# Patient Record
Sex: Female | Born: 1980 | Race: White | Hispanic: No | Smoking: Current every day smoker
Health system: Southern US, Community
[De-identification: ages and names within clinical notes are randomized; demographics above are authoritative.]

## PROBLEM LIST (undated history)

## (undated) DIAGNOSIS — E282 Polycystic ovarian syndrome: Secondary | ICD-10-CM

## (undated) DIAGNOSIS — K279 Peptic ulcer, site unspecified, unspecified as acute or chronic, without hemorrhage or perforation: Secondary | ICD-10-CM

## (undated) DIAGNOSIS — M79669 Pain in unspecified lower leg: Secondary | ICD-10-CM

## (undated) DIAGNOSIS — F410 Panic disorder [episodic paroxysmal anxiety] without agoraphobia: Secondary | ICD-10-CM

## (undated) HISTORY — DX: Pain in unspecified lower leg: M79.669

## (undated) HISTORY — DX: Panic disorder (episodic paroxysmal anxiety): F41.0

## (undated) HISTORY — DX: Polycystic ovarian syndrome: E28.2

## (undated) HISTORY — DX: Peptic ulcer, site unspecified, unspecified as acute or chronic, without hemorrhage or perforation: K27.9

## (undated) HISTORY — PX: MOUTH SURGERY: SHX715

---

## 2003-05-06 ENCOUNTER — Encounter: Payer: Self-pay | Admitting: Otolaryngology

## 2003-05-06 ENCOUNTER — Encounter: Admission: RE | Admit: 2003-05-06 | Discharge: 2003-05-06 | Payer: Self-pay | Admitting: Otolaryngology

## 2003-07-06 ENCOUNTER — Other Ambulatory Visit: Admission: RE | Admit: 2003-07-06 | Discharge: 2003-07-06 | Payer: Self-pay | Admitting: Family Medicine

## 2003-07-06 ENCOUNTER — Other Ambulatory Visit: Admission: RE | Admit: 2003-07-06 | Discharge: 2003-07-06 | Payer: Self-pay | Admitting: Obstetrics & Gynecology

## 2003-12-01 ENCOUNTER — Ambulatory Visit (HOSPITAL_COMMUNITY): Admission: RE | Admit: 2003-12-01 | Discharge: 2003-12-01 | Payer: Self-pay | Admitting: Obstetrics & Gynecology

## 2003-12-13 ENCOUNTER — Inpatient Hospital Stay (HOSPITAL_COMMUNITY): Admission: AD | Admit: 2003-12-13 | Discharge: 2003-12-13 | Payer: Self-pay | Admitting: Obstetrics & Gynecology

## 2004-02-13 ENCOUNTER — Inpatient Hospital Stay (HOSPITAL_COMMUNITY): Admission: AD | Admit: 2004-02-13 | Discharge: 2004-02-15 | Payer: Self-pay | Admitting: Obstetrics & Gynecology

## 2005-06-07 ENCOUNTER — Emergency Department (HOSPITAL_COMMUNITY): Admission: EM | Admit: 2005-06-07 | Discharge: 2005-06-07 | Payer: Self-pay | Admitting: Family Medicine

## 2005-08-14 ENCOUNTER — Other Ambulatory Visit: Admission: RE | Admit: 2005-08-14 | Discharge: 2005-08-14 | Payer: Self-pay | Admitting: Obstetrics and Gynecology

## 2005-08-15 ENCOUNTER — Ambulatory Visit (HOSPITAL_COMMUNITY): Admission: RE | Admit: 2005-08-15 | Discharge: 2005-08-15 | Payer: Self-pay | Admitting: Obstetrics and Gynecology

## 2005-08-15 ENCOUNTER — Encounter (INDEPENDENT_AMBULATORY_CARE_PROVIDER_SITE_OTHER): Payer: Self-pay | Admitting: Specialist

## 2006-06-28 ENCOUNTER — Ambulatory Visit (HOSPITAL_COMMUNITY): Admission: RE | Admit: 2006-06-28 | Discharge: 2006-06-28 | Payer: Self-pay | Admitting: Obstetrics and Gynecology

## 2007-02-09 ENCOUNTER — Emergency Department (HOSPITAL_COMMUNITY): Admission: EM | Admit: 2007-02-09 | Discharge: 2007-02-09 | Payer: Self-pay | Admitting: Emergency Medicine

## 2007-09-24 ENCOUNTER — Encounter: Admission: RE | Admit: 2007-09-24 | Discharge: 2007-09-24 | Payer: Self-pay | Admitting: Certified Nurse Midwife

## 2008-12-17 ENCOUNTER — Emergency Department (HOSPITAL_COMMUNITY): Admission: EM | Admit: 2008-12-17 | Discharge: 2008-12-17 | Payer: Self-pay | Admitting: Family Medicine

## 2010-04-09 ENCOUNTER — Emergency Department (HOSPITAL_COMMUNITY): Admission: EM | Admit: 2010-04-09 | Discharge: 2010-04-09 | Payer: Self-pay | Admitting: Family Medicine

## 2010-10-03 ENCOUNTER — Inpatient Hospital Stay (HOSPITAL_COMMUNITY)
Admission: AD | Admit: 2010-10-03 | Discharge: 2010-10-05 | Payer: Self-pay | Source: Home / Self Care | Attending: Obstetrics and Gynecology | Admitting: Obstetrics and Gynecology

## 2010-10-04 ENCOUNTER — Encounter (INDEPENDENT_AMBULATORY_CARE_PROVIDER_SITE_OTHER): Payer: Self-pay | Admitting: Obstetrics and Gynecology

## 2010-10-05 LAB — CBC
HCT: 34.6 % — ABNORMAL LOW (ref 36.0–46.0)
Hemoglobin: 12.1 g/dL (ref 12.0–15.0)
MCH: 30.9 pg (ref 26.0–34.0)
MCHC: 35 g/dL (ref 30.0–36.0)
MCV: 88.5 fL (ref 78.0–100.0)
Platelets: 222 10*3/uL (ref 150–400)
RBC: 3.91 MIL/uL (ref 3.87–5.11)
RDW: 13.7 % (ref 11.5–15.5)
WBC: 25.1 10*3/uL — ABNORMAL HIGH (ref 4.0–10.5)

## 2010-10-05 LAB — DIFFERENTIAL
Basophils Absolute: 0 10*3/uL (ref 0.0–0.1)
Basophils Relative: 0 % (ref 0–1)
Eosinophils Absolute: 0.1 10*3/uL (ref 0.0–0.7)
Eosinophils Relative: 0 % (ref 0–5)
Lymphocytes Relative: 10 % — ABNORMAL LOW (ref 12–46)
Lymphs Abs: 2.6 10*3/uL (ref 0.7–4.0)
Monocytes Absolute: 1.9 10*3/uL — ABNORMAL HIGH (ref 0.1–1.0)
Monocytes Relative: 8 % (ref 3–12)
Neutro Abs: 20.5 10*3/uL — ABNORMAL HIGH (ref 1.7–7.7)
Neutrophils Relative %: 82 % — ABNORMAL HIGH (ref 43–77)

## 2010-10-05 LAB — URINALYSIS, MICROSCOPIC ONLY
Bilirubin Urine: NEGATIVE
Ketones, ur: 15 mg/dL — AB
Nitrite: NEGATIVE
Protein, ur: 30 mg/dL — AB
Specific Gravity, Urine: <1.005 — ABNORMAL LOW (ref 1.005–1.030)
Urine Glucose, Fasting: NEGATIVE mg/dL
Urobilinogen, UA: 0.2 mg/dL (ref 0.0–1.0)
pH: 6.5 (ref 5.0–8.0)

## 2010-10-05 LAB — ABO/RH: ABO/RH(D): A NEG

## 2010-10-05 LAB — RPR: RPR Ser Ql: NONREACTIVE

## 2010-10-10 LAB — RH IMMUNE GLOB WKUP(>/=20WKS)(NOT WOMEN'S HOSP)
Antibody Screen: POSITIVE
DAT, IgG: NEGATIVE
Fetal Screen: NEGATIVE
Unit division: 0

## 2010-10-10 LAB — CBC
HCT: 34.8 % — ABNORMAL LOW (ref 36.0–46.0)
Hemoglobin: 11.6 g/dL — ABNORMAL LOW (ref 12.0–15.0)
MCH: 30.1 pg (ref 26.0–34.0)
MCHC: 33.3 g/dL (ref 30.0–36.0)
MCV: 90.2 fL (ref 78.0–100.0)
Platelets: 198 10*3/uL (ref 150–400)
RBC: 3.86 MIL/uL — ABNORMAL LOW (ref 3.87–5.11)
RDW: 13.6 % (ref 11.5–15.5)
WBC: 15.7 10*3/uL — ABNORMAL HIGH (ref 4.0–10.5)

## 2010-10-10 LAB — COMPREHENSIVE METABOLIC PANEL
ALT: 13 U/L (ref 0–35)
AST: 27 U/L (ref 0–37)
Albumin: 2.7 g/dL — ABNORMAL LOW (ref 3.5–5.2)
Alkaline Phosphatase: 87 U/L (ref 39–117)
BUN: 5 mg/dL — ABNORMAL LOW (ref 6–23)
CO2: 25 mEq/L (ref 19–32)
Calcium: 9 mg/dL (ref 8.4–10.5)
Chloride: 104 mEq/L (ref 96–112)
Creatinine, Ser: 0.39 mg/dL — ABNORMAL LOW (ref 0.4–1.2)
GFR calc Af Amer: >60 mL/min (ref >60)
GFR calc non Af Amer: >60 mL/min (ref >60)
Glucose, Bld: 81 mg/dL (ref 70–99)
Potassium: 3.8 mEq/L (ref 3.5–5.1)
Sodium: 136 mEq/L (ref 135–145)
Total Bilirubin: 0.4 mg/dL (ref 0.3–1.2)
Total Protein: 5.5 g/dL — ABNORMAL LOW (ref 6.0–8.3)

## 2010-10-10 LAB — DIFFERENTIAL
Basophils Absolute: 0 10*3/uL (ref 0.0–0.1)
Basophils Relative: 0 % (ref 0–1)
Eosinophils Absolute: 0.4 10*3/uL (ref 0.0–0.7)
Eosinophils Relative: 2 % (ref 0–5)
Lymphocytes Relative: 21 % (ref 12–46)
Lymphs Abs: 3.3 10*3/uL (ref 0.7–4.0)
Monocytes Absolute: 1.5 10*3/uL — ABNORMAL HIGH (ref 0.1–1.0)
Monocytes Relative: 10 % (ref 3–12)
Neutro Abs: 10.5 10*3/uL — ABNORMAL HIGH (ref 1.7–7.7)
Neutrophils Relative %: 67 % (ref 43–77)

## 2010-10-10 LAB — CULTURE, ROUTINE-ABSCESS

## 2010-10-10 LAB — URIC ACID: Uric Acid, Serum: 4.7 mg/dL (ref 2.4–7.0)

## 2010-10-11 LAB — ANAEROBIC CULTURE

## 2010-12-28 LAB — POCT URINALYSIS DIP (DEVICE)
Bilirubin Urine: NEGATIVE
Glucose, UA: NEGATIVE mg/dL
Ketones, ur: NEGATIVE mg/dL
Nitrite: NEGATIVE
Protein, ur: NEGATIVE mg/dL
Specific Gravity, Urine: <=1.005 (ref 1.005–1.030)
Urobilinogen, UA: 0.2 mg/dL (ref 0.0–1.0)
pH: 6 (ref 5.0–8.0)

## 2010-12-28 LAB — POCT PREGNANCY, URINE: Preg Test, Ur: NEGATIVE

## 2011-02-03 NOTE — Op Note (Signed)
NAMESHELDON, AMARA             ACCOUNT NO.:  [id number]   MEDICAL RECORD NO.:  [id number]          PATIENT TYPE:  AMB   LOCATION:  SDC                           FACILITY:  [facility]   PHYSICIAN:  Duke Salvia. Marcelle Overlie, M.D.DATE OF BIRTH:  [birth year]   DATE OF PROCEDURE:  08/15/2005  DATE OF DISCHARGE:                                 OPERATIVE REPORT   PREOPERATIVE DIAGNOSIS:  Missed AB.   POSTOPERATIVE DIAGNOSIS:  Missed AB.   PROCEDURE:  D&E.   SURGEON:  Duke Salvia. Marcelle Overlie, M.D.   ANESTHESIA:  Sedation plus paracervical block.   COMPLICATIONS:  None.   ESTIMATED BLOOD LOSS:  Minimal.   SPECIMENS REMOVED:  Products of conception.   DESCRIPTION OF PROCEDURE:  The patient was taken to the operating room and  after adequate level of sedation was obtained with the patient's legs in  stirrups, perineum and vagina prepped and draped with Betadine. Bladder was  drained, EUA carried out. Uterus 8-9 weeks size, mid position, adnexa  negative. The speculum was positioned. Cervix grasped with tenacula.  Paracervical block was then created by infiltrating at 3 and 9 o'clock  submucosally 5-7 mL of 1% Xylocaine on either side after negative  aspiration. The uterus then sounded to 9-10 cm progressively dilated to a 27-  29 Pratt dilator. Eight curved suction curette was then used to curet a  moderate amount of tissue. When no further tissue could be removed, a medium  blunt curette was used to explore the cavity which was clean, the walls were  firm with minimal bleeding at that point. She tolerated this well and went  to recovery room in good condition.      Richard M. Marcelle Overlie, M.D.  Electronically Signed     [facility]/MEDQ  D:  08/15/2005  T:  08/15/2005  Job:  [id number]

## 2011-07-21 ENCOUNTER — Encounter: Payer: Self-pay | Admitting: Vascular Surgery

## 2011-07-21 ENCOUNTER — Other Ambulatory Visit: Payer: Self-pay

## 2011-07-27 ENCOUNTER — Encounter: Payer: Self-pay | Admitting: Vascular Surgery

## 2011-07-28 ENCOUNTER — Ambulatory Visit (INDEPENDENT_AMBULATORY_CARE_PROVIDER_SITE_OTHER): Payer: Managed Care, Other (non HMO) | Admitting: Vascular Surgery

## 2011-07-28 ENCOUNTER — Encounter: Payer: Self-pay | Admitting: Vascular Surgery

## 2011-07-28 VITALS — BP 115/75 | HR 114 | Ht 66.0 in | Wt 189.0 lb

## 2011-07-28 DIAGNOSIS — M79609 Pain in unspecified limb: Secondary | ICD-10-CM

## 2011-07-28 NOTE — Progress Notes (Signed)
[facility] [place]  Referred by:  Milus Height, PA No address on file  Reason for referral: B calf pain  History of Present Illness  Amber King is a 30 y.o. female who presents with chief complaint: B calf pain.  Onset of symptom occurred few years prior to presentation.  Pain is described as cramping, severity 3-6/10, and without clear association.  Patient has attempted to treat this pain with rest and OTC medications.  The patient has neither intermittent claudication nor rest pain.  She denies any leg wounds or ulcers.  Atherosclerotic risk factors include: smoking.  Past Medical History  Diagnosis Date  . Peptic ulcer disease   . Panic attacks   . PCOS (polycystic ovarian syndrome)   . Calf pain     No past surgical history on file.  History   Social History  . Marital Status: Married    Spouse Name: N/A    Number of Children: N/A  . Years of Education: N/A   Occupational History  . Not on file.   Social History Main Topics  . Smoking status: Current Everyday Smoker -- 1.0 packs/day    Types: Cigarettes  . Smokeless tobacco: Not on file  . Alcohol Use: 1.0 oz/week    2 drink(s) per week  . Drug Use: No  . Sexually Active:    Other Topics Concern  . Not on file   Social History Narrative  . No narrative on file    No family history on file.  Current Outpatient Prescriptions on File Prior to Visit  Medication Sig Dispense Refill  . FLUoxetine (PROZAC) 40 MG capsule Take 40 mg by mouth daily.        . ranitidine (ZANTAC) 150 MG capsule Take 150 mg by mouth 2 (two) times daily.          No Known Allergies   Review of Systems (Positive items checked otherwise negative)  General: [ ]  Weight loss, [ ]  Weight gain, [ ]   Loss of appetite, [ ]  Fever  Neurologic: [x ] Dizziness, [ ]  Blackouts, [ ]  Headaches, [ ]  Seizure  Ear/Nose/Throat: [ ]  Change in eyesight, [ ]  Change in hearing, [ ]  Nose bleeds, [ ]  Sore throat  Vascular:  [x ] Pain in legs with walking, [ ]  Pain in feet while lying flat, [ ]  Non-healing ulcer, Stroke, [ ]  "Mini stroke", [ ]  Slurred speech, [ ]  Temporary blindness, [ ]  Blood clot in vein, [ ]  Phlebitis  Pulmonary: [ ]  Home oxygen, [ ]  Productive cough, [ ]  Bronchitis, [ ]  Coughing up blood,  [ ]  Asthma, [ ]  Wheezing  Musculoskeletal: [ ]  Arthritis, [ ]  Joint pain, [ ]  Muscle pain  Cardiac: [x]  Chest pain, [ ]  Chest tightness/pressure, [ ]  Shortness of breath when lying flat, [x]  Shortness of breath with exertion, [x]  Palpitations, [ ]  Heart murmur, [ ]  Arrythmia,  [ ]  Atrial fibrillation  Hematologic: [ ]  Bleeding problems, [ ]  Clotting disorder, [ ]  Anemia  Psychiatric:  [x ] Depression, [ ]  Anxiety, [ ]  Attention deficit disorder  Gastrointestinal:  [ ]  Black stool,[ ]   Blood in stool, [ ]  Peptic ulcer disease, [ ]  Reflux, [ ]  Hiatal hernia, [ ]  Trouble swallowing, [ ]  Diarrhea, [ ]  Constipation  Urinary:  [ ]  Kidney disease, [ ]  Burning with urination, [ ]  Frequent urination, [ ]  Difficulty urinating  Skin: [ ]  Ulcers, [ ]  Rashes   Physical Examination  Filed Vitals:   07/28/11 1439  BP: 115/75  Pulse: 114  Height: 5\' 6"  (1.676 m)  Weight: 189 lb (85.73 kg)  SpO2: 100%   Body mass index is 30.51 kg/(m^2).   General: A&O x 3, WDWN, mildly obese  Head: Heard/AT  Ear/Nose/Throat: Hearing grossly intact, nares w/o erythema or drainage, oropharynx w/o Erythema/Exudate  Eyes: PERRLA, EOMI  Neck: Supple, no nuchal rigidity, no palpable LAD  Pulmonary: Sym exp, good air movt, CTAB, no rales, rhonchi, & wheezing  Cardiac: RRR, Nl S1, S2, no Murmurs, rubs or gallops  Vascular: Vessel Right Left  Radial Palpable Palpable  Brachial Palpable Palpable  Carotid Palpable, without bruit Palpable, without bruit  Aorta Non-palpable N/A  Femoral Palpable Palpable  Popliteal Non-palpable Non-palpable  PT Palpable Palpable  DP Palpable Palpable   Gastrointestinal: soft, NTND,  -G/R, - HSM, - masses, - CVAT B  Musculoskeletal: M/S 5/5 throughout , Extremities without ischemic changes   Neurologic: CN 2-12 intact , Pain and light touch intact in extremities , Motor exam as listed above  Psychiatric: Judgment intact, Mood & affect appropriatefor pt's clinical situation  Dermatologic: See M/S exam for extremity exam, no rashes otherwise noted  Lymph : No Cervical, Axillary, or Inguinal lymphadenopathy   Outside Studies/Documentation 5 pages of outside documents were reviewed including: outpatient workup to date.  Medical Decision Making  Amber King is a 30 y.o. female who presents with: B calf pain not consistent with vascular etiology  There is a finite number of etiologies for leg pain of vascular etiology in 30 year old patients: all of which can be screened with BLE ABI and BLE ABI with forced PF/DF  I discussed in depth with the patient the nature of atherosclerosis, and emphasized the importance of maximal medical management including strict control of blood pressure, blood glucose, and lipid levels, obtaining regular exercise, and cessation of smoking.  The patient is aware that without maximal medical management the underlying atherosclerotic disease process will progress, limiting the benefit of any interventions.  The patient will follow up in 2-4 weeks with the previously noted studies.    Thank you for allowing [place] to participate in this patient's care.  Leonides Sake, MD [facility] [place] Office: [phone] Pager: [phone]  07/28/2011, 6:05 PM

## 2011-08-31 ENCOUNTER — Encounter: Payer: Self-pay | Admitting: Vascular Surgery

## 2011-09-01 ENCOUNTER — Encounter: Payer: Self-pay | Admitting: Vascular Surgery

## 2011-09-01 ENCOUNTER — Other Ambulatory Visit (INDEPENDENT_AMBULATORY_CARE_PROVIDER_SITE_OTHER): Payer: Managed Care, Other (non HMO) | Admitting: *Deleted

## 2011-09-01 ENCOUNTER — Ambulatory Visit (INDEPENDENT_AMBULATORY_CARE_PROVIDER_SITE_OTHER): Payer: Managed Care, Other (non HMO) | Admitting: Vascular Surgery

## 2011-09-01 VITALS — BP 128/84 | HR 103 | Resp 16 | Ht 66.0 in | Wt 185.0 lb

## 2011-09-01 DIAGNOSIS — M79609 Pain in unspecified limb: Secondary | ICD-10-CM

## 2011-09-01 MED ORDER — ACETAMINOPHEN-CODEINE 300-30 MG PO TABS: 1.0000 | ORAL_TABLET | ORAL | Status: AC | PRN

## 2011-09-01 NOTE — Progress Notes (Signed)
Patient called saying she didn't receive Rx for Tylenol #3 today. She said [employer] did not get it either by fax. I talked to [place] at [facility] and she confirmed the above information. I gave her a verbal Rx as per [facility]'s office note and Rx.

## 2011-09-01 NOTE — Progress Notes (Signed)
[facility] [place]  Established Critical Limb Ischemia Patient  History of Present Illness  Amber King is a 30 y.o. female who presents with chief complaint: follow up on studies for B leg pain.  The patient has B calf pain and pain upper extremities, worse at night.  Her sx are not c/w intermittent claudication or rest pain.  She notes should could not sleep last night due to severity of the pain.  Past Medical History, Past Surgical History, Social History, Family History, Medications, Allergies, and Review of Systems are unchanged from previous evaluation on 07/28/11.  Physical Examination  Filed Vitals:   09/01/11 0957  BP: 128/84  Pulse: 103  Resp: 16  Height: 5\' 6"  (1.676 m)  Weight: 185 lb (83.915 kg)  SpO2: 99%   General: A&O x 3, WDWN  Eyes: PERRLA, EOMI  Pulmonary: Sym exp, good air movt, CTAB, no rales, rhonchi, & wheezing  Cardiac: RRR, Nl S1, S2, no Murmurs, rubs or gallops  Vascular: Vessel Right Left  Radial Palpable Palpable  Brachial Palpable Palpable  Carotid Palpable, without bruit Palpable, without bruit  Aorta Non-palpable N/A  Femoral Palpable Palpable  Popliteal Non-palpable Non-palpable  PT Palpable Palpable  DP Palpable Palpable   Gastrointestinal: soft, NTND, -G/R, - HSM, - masses, - CVAT B  Musculoskeletal: M/S 5/5 throughout , Extremities without ischemic changes   Neurologic: CN 2-12 intact , Pain and light touch intact in extremities , Motor exam as listed above  Non-Invasive Vascular Imaging ABI (Date: 09/01/11)  RLE: 1.1, PT&DP: triphasic  LLE: 1.21, , PT&DP: triphasic  Medical Decision Making  Amber King is a 30 y.o. female who presents with: B leg pain.   The pt's sx and hx are not c/w vasculogenic leg pain  The screening ABI demonstrate normal arterial flow in both legs, thus it is unlikely her pain is due to an arterial etiology  Her pain is severe enough to interfere with her pain, so I  gave her a limited prescription of Tylenol #3 to help with sx control while her work up continues  Further work up with an Forensic psychologist likely indicated.  I discussed in depth with the patient the nature of atherosclerosis, and emphasized the importance of maximal medical management including strict control of blood pressure, blood glucose, and lipid levels, obtaining regular exercise, and cessation of smoking.  The patient is aware that without maximal medical management the underlying atherosclerotic disease process will progress, limiting the benefit of any interventions.  Thank you for allowing [place] to participate in this patient's care.  Leonides Sake, MD [facility] [place] Office: [phone] Pager: [phone]

## 2012-05-09 ENCOUNTER — Other Ambulatory Visit (HOSPITAL_COMMUNITY): Payer: Self-pay | Admitting: Chiropractic Medicine

## 2012-05-09 ENCOUNTER — Ambulatory Visit (HOSPITAL_COMMUNITY)
Admission: RE | Admit: 2012-05-09 | Discharge: 2012-05-09 | Disposition: A | Discharge status: Home / Self Care | Payer: Managed Care, Other (non HMO) | Source: Ambulatory Visit | Attending: Chiropractic Medicine | Admitting: Chiropractic Medicine

## 2012-05-09 DIAGNOSIS — M545 Low back pain, unspecified: Secondary | ICD-10-CM | POA: Insufficient documentation

## 2016-01-10 ENCOUNTER — Encounter (HOSPITAL_COMMUNITY): Payer: Self-pay | Admitting: Emergency Medicine

## 2016-01-10 DIAGNOSIS — R109 Unspecified abdominal pain: Secondary | ICD-10-CM | POA: Diagnosis not present

## 2016-01-10 DIAGNOSIS — F410 Panic disorder [episodic paroxysmal anxiety] without agoraphobia: Secondary | ICD-10-CM | POA: Diagnosis not present

## 2016-01-10 DIAGNOSIS — F17210 Nicotine dependence, cigarettes, uncomplicated: Secondary | ICD-10-CM | POA: Insufficient documentation

## 2016-01-10 DIAGNOSIS — B029 Zoster without complications: Secondary | ICD-10-CM | POA: Insufficient documentation

## 2016-01-10 DIAGNOSIS — Z8719 Personal history of other diseases of the digestive system: Secondary | ICD-10-CM | POA: Diagnosis not present

## 2016-01-10 DIAGNOSIS — R21 Rash and other nonspecific skin eruption: Secondary | ICD-10-CM | POA: Diagnosis present

## 2016-01-10 DIAGNOSIS — Z7982 Long term (current) use of aspirin: Secondary | ICD-10-CM | POA: Insufficient documentation

## 2016-01-10 DIAGNOSIS — Z8639 Personal history of other endocrine, nutritional and metabolic disease: Secondary | ICD-10-CM | POA: Diagnosis not present

## 2016-01-10 DIAGNOSIS — Z79899 Other long term (current) drug therapy: Secondary | ICD-10-CM | POA: Diagnosis not present

## 2016-01-10 LAB — URINALYSIS, ROUTINE W REFLEX MICROSCOPIC
BILIRUBIN URINE: NEGATIVE
GLUCOSE, UA: NEGATIVE mg/dL
KETONES UR: NEGATIVE mg/dL
Nitrite: NEGATIVE
PROTEIN: NEGATIVE mg/dL
Specific Gravity, Urine: 1.014 (ref 1.005–1.030)
pH: 6 (ref 5.0–8.0)

## 2016-01-10 LAB — COMPREHENSIVE METABOLIC PANEL
ALK PHOS: 48 U/L (ref 38–126)
ALT: 12 U/L — AB (ref 14–54)
AST: 18 U/L (ref 15–41)
Albumin: 4.2 g/dL (ref 3.5–5.0)
Anion gap: 10 (ref 5–15)
BUN: 7 mg/dL (ref 6–20)
CALCIUM: 9.2 mg/dL (ref 8.9–10.3)
CO2: 21 mmol/L — AB (ref 22–32)
CREATININE: 0.73 mg/dL (ref 0.44–1.00)
Chloride: 109 mmol/L (ref 101–111)
GFR calc non Af Amer: >60 mL/min (ref >60)
Glucose, Bld: 109 mg/dL — ABNORMAL HIGH (ref 65–99)
Potassium: 4 mmol/L (ref 3.5–5.1)
SODIUM: 140 mmol/L (ref 135–145)
Total Bilirubin: 0.2 mg/dL — ABNORMAL LOW (ref 0.3–1.2)
Total Protein: 6.9 g/dL (ref 6.5–8.1)

## 2016-01-10 LAB — LIPASE, BLOOD: Lipase: 34 U/L (ref 11–51)

## 2016-01-10 LAB — CBC
HCT: 40.8 % (ref 36.0–46.0)
Hemoglobin: 13.7 g/dL (ref 12.0–15.0)
MCH: 29.8 pg (ref 26.0–34.0)
MCHC: 33.6 g/dL (ref 30.0–36.0)
MCV: 88.7 fL (ref 78.0–100.0)
PLATELETS: 199 10*3/uL (ref 150–400)
RBC: 4.6 MIL/uL (ref 3.87–5.11)
RDW: 13.5 % (ref 11.5–15.5)
WBC: 7.9 10*3/uL (ref 4.0–10.5)

## 2016-01-10 LAB — URINE MICROSCOPIC-ADD ON

## 2016-01-10 LAB — POC URINE PREG, ED: PREG TEST UR: NEGATIVE

## 2016-01-10 NOTE — ED Notes (Signed)
Pt. reports LUQ pain radiating to left back with emesis and diarrhea , denies fever or chills , pt. added itchy/painful skin rashes at left lateral ribcage .

## 2016-01-11 ENCOUNTER — Emergency Department (HOSPITAL_COMMUNITY)
Admission: EM | Admit: 2016-01-11 | Discharge: 2016-01-11 | Disposition: A | Discharge status: Home / Self Care | Payer: BLUE CROSS/BLUE SHIELD | Attending: Emergency Medicine | Admitting: Emergency Medicine

## 2016-01-11 DIAGNOSIS — B029 Zoster without complications: Secondary | ICD-10-CM

## 2016-01-11 MED ORDER — VALACYCLOVIR HCL 1 G PO TABS: 1000.0000 mg | ORAL_TABLET | Freq: Three times a day (TID) | ORAL | Status: DC

## 2016-01-11 MED ORDER — OXYCODONE-ACETAMINOPHEN 5-325 MG PO TABS
1.0000 | ORAL_TABLET | Freq: Once | ORAL | Status: AC
  Administered 2016-01-11: 1 via ORAL
  Filled 2016-01-11: qty 1

## 2016-01-11 MED ORDER — PREDNISONE 20 MG PO TABS: ORAL_TABLET | ORAL | Status: DC

## 2016-01-11 MED ORDER — OXYCODONE-ACETAMINOPHEN 5-325 MG PO TABS: 1.0000 | ORAL_TABLET | Freq: Four times a day (QID) | ORAL | Status: DC | PRN

## 2016-01-11 NOTE — Discharge Instructions (Signed)
Take the prednisone and valacyclovir until gone. Take the percocet for pain as needed.  Recheck if you get a fever, headache or you seem worse.    Shingles Shingles, which is also known as herpes zoster, is an infection that causes a painful skin rash and fluid-filled blisters. Shingles is not related to genital herpes, which is a sexually transmitted infection.   Shingles only develops in people who:  Have had chickenpox.  Have received the chickenpox vaccine. (This is rare.) CAUSES Shingles is caused by varicella-zoster virus (VZV). This is the same virus that causes chickenpox. After exposure to VZV, the virus stays in the body in an inactive (dormant) state. Shingles develops if the virus reactivates. This can happen many years after the initial exposure to VZV. It is not known what causes this virus to reactivate. RISK FACTORS People who have had chickenpox or received the chickenpox vaccine are at risk for shingles. Infection is more common in people who:  Are older than age 32.  Have a weakened defense (immune) system, such as those with HIV, AIDS, or cancer.  Are taking medicines that weaken the immune system, such as transplant medicines.  Are under great stress. SYMPTOMS Early symptoms of this condition include itching, tingling, and pain in an area on your skin. Pain may be described as burning, stabbing, or throbbing. A few days or weeks after symptoms start, a painful red rash appears, usually on one side of the body in a bandlike or beltlike pattern. The rash eventually turns into fluid-filled blisters that break open, scab over, and dry up in about 2-3 weeks. At any time during the infection, you may also develop:  A fever.  Chills.  A headache.  An upset stomach. DIAGNOSIS This condition is diagnosed with a skin exam. Sometimes, skin or fluid samples are taken from the blisters before a diagnosis is made. These samples are examined under a microscope or sent to a  lab for testing. TREATMENT There is no specific cure for this condition. Your health care provider will probably prescribe medicines to help you manage pain, recover more quickly, and avoid long-term problems. Medicines may include:  Antiviral drugs.  Anti-inflammatory drugs.  Pain medicines. If the area involved is on your face, you may be referred to a specialist, such as an eye doctor (ophthalmologist) or an ear, nose, and throat (ENT) doctor to help you avoid eye problems, chronic pain, or disability. HOME CARE INSTRUCTIONS Medicines  Take medicines only as directed by your health care provider.  Apply an anti-itch or numbing cream to the affected area as directed by your health care provider. Blister and Rash Care  Take a cool bath or apply cool compresses to the area of the rash or blisters as directed by your health care provider. This may help with pain and itching.  Keep your rash covered with a loose bandage (dressing). Wear loose-fitting clothing to help ease the pain of material rubbing against the rash.  Keep your rash and blisters clean with mild soap and cool water or as directed by your health care provider.  Check your rash every day for signs of infection. These include redness, swelling, and pain that lasts or increases.  Do not pick your blisters.  Do not scratch your rash. General Instructions  Rest as directed by your health care provider.  Keep all follow-up visits as directed by your health care provider. This is important.  Until your blisters scab over, your infection can cause chickenpox in  people who have never had it or been vaccinated against it. To prevent this from happening, avoid contact with other people, especially:  Babies.  Pregnant women.  Children who have eczema.  Elderly people who have transplants.  People who have chronic illnesses, such as leukemia or AIDS. SEEK MEDICAL CARE IF:  Your pain is not relieved with prescribed  medicines.  Your pain does not get better after the rash heals.  Your rash looks infected. Signs of infection include redness, swelling, and pain that lasts or increases. SEEK IMMEDIATE MEDICAL CARE IF:  The rash is on your face or nose.  You have facial pain, pain around your eye area, or loss of feeling on one side of your face.  You have ear pain or you have ringing in your ear.  You have loss of taste.  Your condition gets worse.   This information is not intended to replace advice given to you by your health care provider. Make sure you discuss any questions you have with your health care provider.   Document Released: 09/04/2005 Document Revised: 09/25/2014 Document Reviewed: 07/16/2014 Elsevier Interactive Patient Education [employer]2016 Elsevier Inc.

## 2016-01-11 NOTE — ED Provider Notes (Signed)
CSN: [id number]     Arrival date & time 01/10/16  2142 History   First MD Initiated Contact with Patient 01/11/16 0515    Chief Complaint  Patient presents with  . Abdominal Pain  . Rash     (Consider location/radiation/quality/duration/timing/severity/associated sxs/prior Treatment) HPI patient states she started getting left-sided abdominal pain that radiates into her left flank area that started about a week ago. She states there's a constant dull ache and every once while there is a sharp shooting pain. She states laying on her left side or if something rubs on the area it makes it hurt more. She states she noticed a rash starting about 3 days ago. She denies any fever. She has a mild cough consistent with her history of smoking. She states she's never had chickenpox before and has not been exposed to chickenpox. Her children did take the chickenpox vaccine but not recently.  PCP Dr Massie MaroonE Griffin  Past Medical History  Diagnosis Date  . Peptic ulcer disease   . Panic attacks   . PCOS (polycystic ovarian syndrome)   . Calf pain    History reviewed. No pertinent past surgical history. No family history on file. Social History  Substance Use Topics  . Smoking status: Current Every Day Smoker -- 0.00 packs/day    Types: Cigarettes  . Smokeless tobacco: None  . 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     Allergies  Review of patient's allergies indicates no known allergies.  Home Medications   Prior to Admission medications   Medication Sig Start Date End Date Taking? Authorizing Provider  aspirin 325 MG tablet Take 325 mg by mouth daily.      Historical Provider, MD  clonazePAM (KLONOPIN) 0.5 MG tablet Take 0.5 mg by mouth at bedtime as needed.      Historical Provider, MD  FLUoxetine (PROZAC) 40 MG  capsule Take 40 mg by mouth daily.      Historical Provider, MD  gabapentin (NEURONTIN) 300 MG capsule Take 300 mg by mouth 2 (two) times daily.      Historical Provider, MD  ibuprofen (ADVIL,MOTRIN) 200 MG tablet Take 200 mg by mouth every 6 (six) hours as needed.      Historical Provider, MD  oxyCODONE-acetaminophen (PERCOCET/ROXICET) 5-325 MG tablet Take 1 tablet by mouth every 6 (six) hours as needed for severe pain. 01/11/16   Devoria AlbeIva Prisca Gearing, MD  predniSONE (DELTASONE) 20 MG tablet Take 3 po QD x 3d , then 2 po QD x 3d then 1 po QD x 3d 01/11/16   Devoria AlbeIva Esthefany Herrig, MD  ranitidine (ZANTAC) 150 MG capsule Take 150 mg by mouth 2 (two) times daily.      Historical Provider, MD  valACYclovir (VALTREX) 1000 MG tablet Take 1 tablet (1,000 mg total) by mouth 3 (three) times daily. 01/11/16   Devoria AlbeIva Haskell Rihn, MD   BP 107/87 mmHg  Pulse 69  Temp(Src) 98.3 F (36.8 C) (Oral)  Resp 16  Ht 5\' 5"  (1.651 m)  Wt 153 lb 3 oz (69.485 kg)  BMI 25.49 kg/m2  SpO2 100%  LMP 12/11/2015 (Approximate)  Vital signs normal   Physical Exam  Constitutional: She is oriented to person, place, and time. She appears well-developed and well-nourished.  Non-toxic appearance.  She does not appear ill. No distress.  HENT:  Head: Normocephalic and atraumatic.  Right Ear: External ear normal.  Left Ear: External ear normal.  Nose: Nose normal. No mucosal edema or rhinorrhea.  Mouth/Throat: Oropharynx is clear and moist and mucous membranes are normal. No dental abscesses or uvula swelling.  Eyes: Conjunctivae and EOM are normal. Pupils are equal, round, and reactive to light.  Neck: Normal range of motion and full passive range of motion without pain. Neck supple.  Cardiovascular: Normal rate, regular rhythm and normal heart sounds.  Exam reveals no gallop and no friction rub.   No murmur heard. Pulmonary/Chest: Effort normal and breath sounds normal. No respiratory distress. She has no wheezes. She has no rhonchi. She has no rales. She  exhibits no tenderness and no crepitus.    Abdominal: Soft. Normal appearance and bowel sounds are normal. She exhibits no distension. There is no tenderness. There is no rebound and no guarding.  Musculoskeletal: Normal range of motion. She exhibits no edema or tenderness.       Back:  Moves all extremities well.   Neurological: She is alert and oriented to person, place, and time. She has normal strength. No cranial nerve deficit.  Skin: Skin is warm, dry and intact. No rash noted. No erythema. No pallor.  Patient is noted to have some redness of the skin in the distribution of approximately the T7 dermatome with some slightly raised early blisters consistent with early shingles. It extends from her midline back to her midline anterior chest.  Psychiatric: She has a normal mood and affect. Her speech is normal and behavior is normal. Her mood appears not anxious.  Nursing note and vitals reviewed.   ED Course  Procedures (including critical care time)  Medications  oxyCODONE-acetaminophen (PERCOCET/ROXICET) 5-325 MG per tablet 1 tablet (not administered)   We discussed this rash is consistent with shingles or herpes zoster. She was advised to stay away from any unvaccinated children, patient's getting chemotherapy, or pregnant females.    Labs Review Results for orders placed or performed during the hospital encounter of 01/11/16  Lipase, blood  Result Value Ref Range   Lipase 34 11 - 51 U/L  Comprehensive metabolic panel  Result Value Ref Range   Sodium 140 135 - 145 mmol/L   Potassium 4.0 3.5 - 5.1 mmol/L   Chloride 109 101 - 111 mmol/L   CO2 21 (L) 22 - 32 mmol/L   Glucose, Bld 109 (H) 65 - 99 mg/dL   BUN 7 6 - 20 mg/dL   Creatinine, Ser 1.61 0.44 - 1.00 mg/dL   Calcium 9.2 8.9 - 09.6 mg/dL   Total Protein 6.9 6.5 - 8.1 g/dL   Albumin 4.2 3.5 - 5.0 g/dL   AST 18 15 - 41 U/L   ALT 12 (L) 14 - 54 U/L   Alkaline Phosphatase 48 38 - 126 U/L   Total Bilirubin 0.2 (L) 0.3  - 1.2 mg/dL   GFR calc non Af Amer >60 >60 mL/min   GFR calc Af Amer >60 >60 mL/min   Anion gap 10 5 - 15  CBC  Result Value Ref Range   WBC 7.9 4.0 - 10.5 K/uL   RBC 4.60 3.87 - 5.11 MIL/uL   Hemoglobin 13.7 12.0 - 15.0 g/dL   HCT 04.5 40.9 - 81.1 %   MCV 88.7 78.0 - 100.0 fL   MCH 29.8 26.0 - 34.0 pg   MCHC 33.6 30.0 - 36.0 g/dL  RDW 13.5 11.5 - 15.5 %   Platelets 199 150 - 400 K/uL  Urinalysis, Routine w reflex microscopic (not at [facility])  Result Value Ref Range   Color, Urine YELLOW YELLOW   APPearance CLOUDY (A) CLEAR   Specific Gravity, Urine 1.014 1.005 - 1.030   pH 6.0 5.0 - 8.0   Glucose, UA NEGATIVE NEGATIVE mg/dL   Hgb urine dipstick TRACE (A) NEGATIVE   Bilirubin Urine NEGATIVE NEGATIVE   Ketones, ur NEGATIVE NEGATIVE mg/dL   Protein, ur NEGATIVE NEGATIVE mg/dL   Nitrite NEGATIVE NEGATIVE   Leukocytes, UA TRACE (A) NEGATIVE  Urine microscopic-add on  Result Value Ref Range   Squamous Epithelial / LPF 6-30 (A) NONE SEEN   WBC, UA 0-5 0 - 5 WBC/hpf   RBC / HPF 0-5 0 - 5 RBC/hpf   Bacteria, UA MANY (A) NONE SEEN   Urine-Other MUCOUS PRESENT   POC urine preg, ED (not at [facility])  Result Value Ref Range   Preg Test, Ur NEGATIVE NEGATIVE   Laboratory interpretation all normal except contaminated Urinalysis     MDM   Final diagnoses:  Shingles    New Prescriptions   OXYCODONE-ACETAMINOPHEN (PERCOCET/ROXICET) 5-325 MG TABLET    Take 1 tablet by mouth every 6 (six) hours as needed for severe pain.   PREDNISONE (DELTASONE) 20 MG TABLET    Take 3 po QD x 3d , then 2 po QD x 3d then 1 po QD x 3d   VALACYCLOVIR (VALTREX) 1000 MG TABLET    Take 1 tablet (1,000 mg total) by mouth 3 (three) times daily.    Plan discharge  Devoria Albe, MD, Concha Pyo, MD 01/11/16 [phone]

## 2020-04-22 ENCOUNTER — Other Ambulatory Visit: Payer: Self-pay

## 2020-04-22 ENCOUNTER — Emergency Department (HOSPITAL_COMMUNITY)
Admission: EM | Admit: 2020-04-22 | Discharge: 2020-04-22 | Disposition: A | Discharge status: Home / Self Care | Payer: Medicaid Other | Attending: Emergency Medicine | Admitting: Emergency Medicine

## 2020-04-22 DIAGNOSIS — R220 Localized swelling, mass and lump, head: Secondary | ICD-10-CM | POA: Insufficient documentation

## 2020-04-22 DIAGNOSIS — K011 Impacted teeth: Secondary | ICD-10-CM | POA: Diagnosis not present

## 2020-04-22 DIAGNOSIS — Z5321 Procedure and treatment not carried out due to patient leaving prior to being seen by health care provider: Secondary | ICD-10-CM | POA: Insufficient documentation

## 2020-04-22 NOTE — ED Notes (Signed)
Pt told us that she was leaving and going to urgent care instead.

## 2020-04-22 NOTE — ED Triage Notes (Signed)
Pt arrives POV for eval of R sided jaw swelling onset 1 week ago. Pt reports her wisdom teeth are impacted. Denies SOB, oropharynx is patent and clear. Denies fevers/chills at home. Denies hx of similar issue

## 2020-04-23 ENCOUNTER — Ambulatory Visit (HOSPITAL_COMMUNITY)
Admission: EM | Admit: 2020-04-23 | Discharge: 2020-04-23 | Disposition: A | Discharge status: Home / Self Care | Payer: Medicaid Other | Attending: Family Medicine | Admitting: Family Medicine

## 2020-04-23 ENCOUNTER — Other Ambulatory Visit: Payer: Self-pay

## 2020-04-23 ENCOUNTER — Encounter (HOSPITAL_COMMUNITY): Payer: Self-pay | Admitting: Emergency Medicine

## 2020-04-23 DIAGNOSIS — K047 Periapical abscess without sinus: Secondary | ICD-10-CM | POA: Diagnosis not present

## 2020-04-23 MED ORDER — LIDOCAINE HCL (PF) 1 % IJ SOLN
INTRAMUSCULAR | Status: AC
  Filled 2020-04-23: qty 2

## 2020-04-23 MED ORDER — CEFTRIAXONE SODIUM 500 MG IJ SOLR
INTRAMUSCULAR | Status: AC
  Filled 2020-04-23: qty 500

## 2020-04-23 MED ORDER — HYDROCODONE-ACETAMINOPHEN 7.5-325 MG PO TABS: 1.0000 | ORAL_TABLET | Freq: Four times a day (QID) | ORAL | 0 refills | Status: DC | PRN

## 2020-04-23 MED ORDER — AMOXICILLIN-POT CLAVULANATE 875-125 MG PO TABS: 1.0000 | ORAL_TABLET | Freq: Two times a day (BID) | ORAL | 0 refills | Status: DC

## 2020-04-23 MED ORDER — CEFTRIAXONE SODIUM 1 G IJ SOLR
1.0000 g | Freq: Once | INTRAMUSCULAR | Status: AC
  Administered 2020-04-23: 1 g via INTRAMUSCULAR

## 2020-04-23 MED ORDER — METRONIDAZOLE 500 MG PO TABS: 500.0000 mg | ORAL_TABLET | Freq: Two times a day (BID) | ORAL | 0 refills | Status: DC

## 2020-04-23 NOTE — ED Triage Notes (Signed)
Pt c/o wisdom tooth pain on the right side x 1 week. She states her face is swollen and her throat hurts and she is unable to tolerate eating. She states her jaw is very sore and it even hurts to yawn.

## 2020-04-23 NOTE — Discharge Instructions (Signed)
Take both antibiotics every 12 hours Take with food Take a probiotic with the antibiotics Drink lots of water Take pain medicine as needed Follow up with dentist

## 2020-04-23 NOTE — ED Provider Notes (Signed)
[facility]    CSN: [id number] Arrival date & time: 04/23/20  6256      History   Chief Complaint Chief Complaint  Patient presents with  . Dental Pain    HPI Amber King is a 39 y.o. female.   HPI  Patient has severe dental pain on the right side.  Progressive swelling in the jaw.  Pain with chewing.  Cannot open her mouth more than a small amount.  Unable to eat.  Can drink fluids.  No fever or chills.  Does not have a dentist.  Past Medical History:  Diagnosis Date  . Calf pain   . Panic attacks   . PCOS (polycystic ovarian syndrome)   . Peptic ulcer disease     Patient Active Problem List   Diagnosis Date Noted  . Pain in limb 07/28/2011    History reviewed. No pertinent surgical history.  OB History   No obstetric history on file.      Home Medications    Prior to Admission medications   Medication Sig Start Date End Date Taking? Authorizing Provider  amoxicillin-clavulanate (AUGMENTIN) 875-125 MG tablet Take 1 tablet by mouth every 12 (twelve) hours. 04/23/20   Eustace Moore, MD  HYDROcodone-acetaminophen (NORCO) 7.5-325 MG tablet Take 1 tablet by mouth every 6 (six) hours as needed for moderate pain. 04/23/20   Eustace Moore, MD  metroNIDAZOLE (FLAGYL) 500 MG tablet Take 1 tablet (500 mg total) by mouth 2 (two) times daily. 04/23/20   Eustace Moore, MD  valACYclovir (VALTREX) 1000 MG tablet Take 1 tablet (1,000 mg total) by mouth 3 (three) times daily. 01/11/16   Devoria Albe, MD  clonazePAM (KLONOPIN) 0.5 MG tablet Take 0.5 mg by mouth at bedtime as needed.    04/23/20  [provider]  FLUoxetine (PROZAC) 40 MG capsule Take 40 mg by mouth daily.    04/23/20  [provider]  gabapentin (NEURONTIN) 300 MG capsule Take 300 mg by mouth 2 (two) times daily.    04/23/20  [provider]  ranitidine (ZANTAC) 150 MG capsule Take 150 mg by mouth 2 (two) times daily.    04/23/20  [provider]    Family  History Family History  Problem Relation Age of Onset  . Healthy Mother   . Healthy Father     Social History Social History   Tobacco Use  . Smoking status: Current Every Day Smoker    Packs/day: 0.50    Types: Cigarettes  . Smokeless tobacco: Never Used  Vaping Use  . Vaping Use: Never used  Substance Use Topics  . Alcohol use: Not Currently    Alcohol/week: 2.0 standard drinks    Types: 2 Standard drinks or equivalent per week  . Drug use: No     Allergies   Patient has no known allergies.   Review of Systems Review of Systems See HPI  Physical Exam Triage Vital Signs ED Triage Vitals  Enc Vitals Group     BP 04/23/20 0853 113/63     Pulse Rate 04/23/20 0853 87     Resp 04/23/20 0853 16     Temp 04/23/20 0853 98.4 F (36.9 C)     Temp Source 04/23/20 0853 Oral     SpO2 04/23/20 0853 100 %     Weight --      Height --      Head Circumference --      Peak Flow --  Pain Score 04/23/20 0849 9     Pain Loc --      Pain Edu? --      Excl. in GC? --    No data found.  Updated Vital Signs BP 113/63 (BP Location: Left Arm)   Pulse 87   Temp 98.4 F (36.9 C) (Oral)   Resp 16   LMP 04/09/2020   SpO2 100%     Physical Exam Constitutional:      General: She is in acute distress.     Appearance: She is well-developed and normal weight.     Comments: Acutely uncomfortable  HENT:     Head: Normocephalic and atraumatic.      Mouth/Throat:     Mouth: Mucous membranes are moist.     Comments: Can only open mouth 1 cm at the incisors.  Poor dentition noted.  Unable to see  posterior molars Eyes:     Conjunctiva/sclera: Conjunctivae normal.     Pupils: Pupils are equal, round, and reactive to light.  Cardiovascular:     Rate and Rhythm: Normal rate.  Pulmonary:     Effort: Pulmonary effort is normal. No respiratory distress.  Abdominal:     General: There is no distension.     Palpations: Abdomen is soft.  Musculoskeletal:        General: Normal  range of motion.     Cervical back: Normal range of motion.  Skin:    General: Skin is warm and dry.  Neurological:     Mental Status: She is alert.  Psychiatric:        Mood and Affect: Mood normal.        Behavior: Behavior normal.      UC Treatments / Results  Labs (all labs ordered are listed, but only abnormal results are displayed) Labs Reviewed - No data to display  EKG   Radiology No results found.  Procedures Procedures (including critical care time)  Medications Ordered in UC Medications  cefTRIAXone (ROCEPHIN) injection 1 g (1 g Intramuscular Given 04/23/20 0940)    Initial Impression / Assessment and Plan / UC Course  I have reviewed the triage vital signs and the nursing notes.  Pertinent labs & imaging results that were available during my care of the patient were reviewed by me and considered in my medical decision making (see chart for details).     Severe dental infection.  Will give 2 antibiotics.  Pain management.  Importance of dental follow-up next week as emphasized Final Clinical Impressions(s) / UC Diagnoses   Final diagnoses:  Dental infection  Dental abscess     Discharge Instructions     Take both antibiotics every 12 hours Take with food Take a probiotic with the antibiotics Drink lots of water Take pain medicine as needed Follow up with dentist    ED Prescriptions    Medication Sig Dispense Auth. Provider   metroNIDAZOLE (FLAGYL) 500 MG tablet Take 1 tablet (500 mg total) by mouth 2 (two) times daily. 14 tablet Eustace Moore, MD   amoxicillin-clavulanate (AUGMENTIN) 875-125 MG tablet Take 1 tablet by mouth every 12 (twelve) hours. 14 tablet Eustace Moore, MD   HYDROcodone-acetaminophen [facility]) 7.5-325 MG tablet Take 1 tablet by mouth every 6 (six) hours as needed for moderate pain. 15 tablet Eustace Moore, MD     I have reviewed the PDMP during this encounter.   Eustace Moore, MD 04/23/20 1050

## 2020-04-26 ENCOUNTER — Inpatient Hospital Stay (HOSPITAL_COMMUNITY)
Admission: EM | Admit: 2020-04-26 | Discharge: 2020-04-29 | DRG: 137 | Disposition: A | Discharge status: Home / Self Care | Payer: Medicaid Other | Attending: Internal Medicine | Admitting: Internal Medicine

## 2020-04-26 ENCOUNTER — Encounter (HOSPITAL_COMMUNITY): Payer: Self-pay

## 2020-04-26 ENCOUNTER — Inpatient Hospital Stay (HOSPITAL_COMMUNITY): Payer: Medicaid Other

## 2020-04-26 ENCOUNTER — Encounter (HOSPITAL_COMMUNITY): Payer: Self-pay | Admitting: Internal Medicine

## 2020-04-26 ENCOUNTER — Observation Stay (HOSPITAL_COMMUNITY): Payer: Medicaid Other

## 2020-04-26 ENCOUNTER — Other Ambulatory Visit: Payer: Self-pay

## 2020-04-26 ENCOUNTER — Ambulatory Visit (HOSPITAL_COMMUNITY)
Admission: EM | Admit: 2020-04-26 | Discharge: 2020-04-26 | Disposition: A | Discharge status: Home / Self Care | Payer: Medicaid Other

## 2020-04-26 ENCOUNTER — Inpatient Hospital Stay (HOSPITAL_COMMUNITY): Payer: Medicaid Other | Admitting: Anesthesiology

## 2020-04-26 ENCOUNTER — Encounter (HOSPITAL_COMMUNITY)
Admission: EM | Disposition: A | Discharge status: Home / Self Care | Payer: Self-pay | Source: Home / Self Care | Attending: Internal Medicine

## 2020-04-26 ENCOUNTER — Emergency Department (HOSPITAL_COMMUNITY): Payer: Medicaid Other

## 2020-04-26 DIAGNOSIS — F17200 Nicotine dependence, unspecified, uncomplicated: Secondary | ICD-10-CM | POA: Diagnosis not present

## 2020-04-26 DIAGNOSIS — F17210 Nicotine dependence, cigarettes, uncomplicated: Secondary | ICD-10-CM | POA: Diagnosis present

## 2020-04-26 DIAGNOSIS — J9811 Atelectasis: Secondary | ICD-10-CM | POA: Diagnosis not present

## 2020-04-26 DIAGNOSIS — K029 Dental caries, unspecified: Secondary | ICD-10-CM | POA: Diagnosis present

## 2020-04-26 DIAGNOSIS — T884XXA Failed or difficult intubation, initial encounter: Secondary | ICD-10-CM

## 2020-04-26 DIAGNOSIS — F1910 Other psychoactive substance abuse, uncomplicated: Secondary | ICD-10-CM | POA: Diagnosis present

## 2020-04-26 DIAGNOSIS — R0689 Other abnormalities of breathing: Secondary | ICD-10-CM | POA: Diagnosis present

## 2020-04-26 DIAGNOSIS — K122 Cellulitis and abscess of mouth: Principal | ICD-10-CM | POA: Diagnosis present

## 2020-04-26 DIAGNOSIS — L03211 Cellulitis of face: Secondary | ICD-10-CM | POA: Diagnosis not present

## 2020-04-26 DIAGNOSIS — K089 Disorder of teeth and supporting structures, unspecified: Secondary | ICD-10-CM

## 2020-04-26 DIAGNOSIS — Z20822 Contact with and (suspected) exposure to covid-19: Secondary | ICD-10-CM | POA: Diagnosis present

## 2020-04-26 DIAGNOSIS — J3489 Other specified disorders of nose and nasal sinuses: Secondary | ICD-10-CM | POA: Diagnosis not present

## 2020-04-26 DIAGNOSIS — K047 Periapical abscess without sinus: Secondary | ICD-10-CM | POA: Diagnosis present

## 2020-04-26 DIAGNOSIS — Z8711 Personal history of peptic ulcer disease: Secondary | ICD-10-CM

## 2020-04-26 DIAGNOSIS — I959 Hypotension, unspecified: Secondary | ICD-10-CM | POA: Diagnosis present

## 2020-04-26 DIAGNOSIS — Z978 Presence of other specified devices: Secondary | ICD-10-CM

## 2020-04-26 DIAGNOSIS — R6884 Jaw pain: Secondary | ICD-10-CM | POA: Diagnosis not present

## 2020-04-26 DIAGNOSIS — F410 Panic disorder [episodic paroxysmal anxiety] without agoraphobia: Secondary | ICD-10-CM | POA: Diagnosis present

## 2020-04-26 DIAGNOSIS — Z789 Other specified health status: Secondary | ICD-10-CM

## 2020-04-26 DIAGNOSIS — K056 Periodontal disease, unspecified: Secondary | ICD-10-CM | POA: Diagnosis not present

## 2020-04-26 HISTORY — PX: INCISION AND DRAINAGE ABSCESS: SHX5864

## 2020-04-26 HISTORY — PX: TOOTH EXTRACTION: SHX859

## 2020-04-26 LAB — BLOOD GAS, ARTERIAL
Acid-Base Excess: 0.5 mmol/L (ref 0.0–2.0)
Bicarbonate: 23.6 mmol/L (ref 20.0–28.0)
Drawn by: 36277
FIO2: 60
O2 Saturation: 99.4 %
Patient temperature: 37
pCO2 arterial: 31.3 mmHg — ABNORMAL LOW (ref 32.0–48.0)
pH, Arterial: 7.489 — ABNORMAL HIGH (ref 7.350–7.450)
pO2, Arterial: 189 mmHg — ABNORMAL HIGH (ref 83.0–108.0)

## 2020-04-26 LAB — CBC WITH DIFFERENTIAL/PLATELET
Abs Immature Granulocytes: 0.07 10*3/uL (ref 0.00–0.07)
Basophils Absolute: 0 10*3/uL (ref 0.0–0.1)
Basophils Relative: 0 %
Eosinophils Absolute: 0 10*3/uL (ref 0.0–0.5)
Eosinophils Relative: 0 %
HCT: 41 % (ref 36.0–46.0)
Hemoglobin: 13.6 g/dL (ref 12.0–15.0)
Immature Granulocytes: 1 %
Lymphocytes Relative: 13 %
Lymphs Abs: 2.1 10*3/uL (ref 0.7–4.0)
MCH: 30.2 pg (ref 26.0–34.0)
MCHC: 33.2 g/dL (ref 30.0–36.0)
MCV: 91.1 fL (ref 80.0–100.0)
Monocytes Absolute: 1 10*3/uL (ref 0.1–1.0)
Monocytes Relative: 7 %
Neutro Abs: 12.1 10*3/uL — ABNORMAL HIGH (ref 1.7–7.7)
Neutrophils Relative %: 79 %
Platelets: 281 10*3/uL (ref 150–400)
RBC: 4.5 MIL/uL (ref 3.87–5.11)
RDW: 12.9 % (ref 11.5–15.5)
WBC: 15.3 10*3/uL — ABNORMAL HIGH (ref 4.0–10.5)
nRBC: 0 % (ref 0.0–0.2)

## 2020-04-26 LAB — TRIGLYCERIDES: Triglycerides: 134 mg/dL (ref <150)

## 2020-04-26 LAB — BASIC METABOLIC PANEL
Anion gap: 12 (ref 5–15)
BUN: 6 mg/dL (ref 6–20)
CO2: 26 mmol/L (ref 22–32)
Calcium: 8.9 mg/dL (ref 8.9–10.3)
Chloride: 99 mmol/L (ref 98–111)
Creatinine, Ser: 0.64 mg/dL (ref 0.44–1.00)
GFR calc Af Amer: >60 mL/min (ref >60)
GFR calc non Af Amer: >60 mL/min (ref >60)
Glucose, Bld: 97 mg/dL (ref 70–99)
Potassium: 3.1 mmol/L — ABNORMAL LOW (ref 3.5–5.1)
Sodium: 137 mmol/L (ref 135–145)

## 2020-04-26 LAB — I-STAT BETA HCG BLOOD, ED (MC, WL, AP ONLY): I-stat hCG, quantitative: <5 m[IU]/mL (ref <5)

## 2020-04-26 LAB — GLUCOSE, CAPILLARY: Glucose-Capillary: 108 mg/dL — ABNORMAL HIGH (ref 70–99)

## 2020-04-26 LAB — HEMOGLOBIN A1C
Hgb A1c MFr Bld: 5.3 % (ref 4.8–5.6)
Mean Plasma Glucose: 105.41 mg/dL

## 2020-04-26 LAB — SARS CORONAVIRUS 2 BY RT PCR (HOSPITAL ORDER, PERFORMED IN ~~LOC~~ HOSPITAL LAB): SARS Coronavirus 2: NEGATIVE

## 2020-04-26 SURGERY — DENTAL RESTORATION/EXTRACTIONS
Anesthesia: General | Laterality: Right

## 2020-04-26 MED ORDER — LIDOCAINE-EPINEPHRINE 2 %-1:100000 IJ SOLN
INTRAMUSCULAR | Status: DC | PRN
  Administered 2020-04-26: 10 mL

## 2020-04-26 MED ORDER — PANTOPRAZOLE SODIUM 40 MG IV SOLR
40.0000 mg | Freq: Every day | INTRAVENOUS | Status: DC
  Administered 2020-04-27 – 2020-04-29 (×3): 40 mg via INTRAVENOUS
  Filled 2020-04-26 (×3): qty 40

## 2020-04-26 MED ORDER — DOCUSATE SODIUM 100 MG PO CAPS
100.0000 mg | ORAL_CAPSULE | Freq: Two times a day (BID) | ORAL | Status: DC
  Administered 2020-04-28 – 2020-04-29 (×2): 100 mg via ORAL
  Filled 2020-04-26 (×3): qty 1

## 2020-04-26 MED ORDER — SODIUM CHLORIDE 0.9 % IV BOLUS
500.0000 mL | Freq: Once | INTRAVENOUS | Status: AC
  Administered 2020-04-26: 500 mL via INTRAVENOUS

## 2020-04-26 MED ORDER — FENTANYL CITRATE (PF) 100 MCG/2ML IJ SOLN
25.0000 ug | INTRAMUSCULAR | Status: DC | PRN
  Administered 2020-04-26 (×2): 50 ug via INTRAVENOUS

## 2020-04-26 MED ORDER — LIDOCAINE 2% (20 MG/ML) 5 ML SYRINGE
INTRAMUSCULAR | Status: DC | PRN
  Administered 2020-04-26: 40 mg via INTRAVENOUS

## 2020-04-26 MED ORDER — MIDAZOLAM HCL 2 MG/2ML IJ SOLN
INTRAMUSCULAR | Status: DC | PRN
  Administered 2020-04-26 (×2): 1 mg via INTRAVENOUS

## 2020-04-26 MED ORDER — ROCURONIUM BROMIDE 100 MG/10ML IV SOLN
INTRAVENOUS | Status: DC | PRN
  Administered 2020-04-26: 30 mg via INTRAVENOUS

## 2020-04-26 MED ORDER — FENTANYL BOLUS VIA INFUSION
20.0000 ug | INTRAVENOUS | Status: DC | PRN
  Filled 2020-04-26: qty 20

## 2020-04-26 MED ORDER — HEPARIN SODIUM (PORCINE) 5000 UNIT/ML IJ SOLN: 5000.0000 [IU] | Freq: Three times a day (TID) | INTRAMUSCULAR | Status: DC

## 2020-04-26 MED ORDER — SODIUM CHLORIDE 0.9 % IV SOLN
3.0000 g | Freq: Three times a day (TID) | INTRAVENOUS | Status: DC
  Administered 2020-04-26 – 2020-04-27 (×2): 3 g via INTRAVENOUS
  Filled 2020-04-26 (×5): qty 8

## 2020-04-26 MED ORDER — PHENYLEPHRINE 40 MCG/ML (10ML) SYRINGE FOR IV PUSH (FOR BLOOD PRESSURE SUPPORT)
PREFILLED_SYRINGE | INTRAVENOUS | Status: AC
  Filled 2020-04-26: qty 20

## 2020-04-26 MED ORDER — FENTANYL CITRATE (PF) 250 MCG/5ML IJ SOLN
INTRAMUSCULAR | Status: AC
  Filled 2020-04-26: qty 5

## 2020-04-26 MED ORDER — ENOXAPARIN SODIUM 40 MG/0.4ML ~~LOC~~ SOLN
40.0000 mg | SUBCUTANEOUS | Status: DC
  Administered 2020-04-27 – 2020-04-29 (×3): 40 mg via SUBCUTANEOUS
  Filled 2020-04-26 (×3): qty 0.4

## 2020-04-26 MED ORDER — DEXAMETHASONE SODIUM PHOSPHATE 10 MG/ML IJ SOLN
INTRAMUSCULAR | Status: AC
  Filled 2020-04-26: qty 1

## 2020-04-26 MED ORDER — PROPOFOL 10 MG/ML IV BOLUS
INTRAVENOUS | Status: AC
  Filled 2020-04-26: qty 20

## 2020-04-26 MED ORDER — VANCOMYCIN HCL IN DEXTROSE 1-5 GM/200ML-% IV SOLN
1000.0000 mg | Freq: Once | INTRAVENOUS | Status: AC
  Administered 2020-04-26: 1000 mg via INTRAVENOUS
  Filled 2020-04-26: qty 200

## 2020-04-26 MED ORDER — VANCOMYCIN HCL 500 MG/100ML IV SOLN
500.0000 mg | Freq: Two times a day (BID) | INTRAVENOUS | Status: DC
  Administered 2020-04-26 – 2020-04-29 (×5): 500 mg via INTRAVENOUS
  Filled 2020-04-26 (×8): qty 100

## 2020-04-26 MED ORDER — ONDANSETRON HCL 4 MG/2ML IJ SOLN
INTRAMUSCULAR | Status: AC
  Filled 2020-04-26: qty 2

## 2020-04-26 MED ORDER — DEXTROSE-NACL 5-0.45 % IV SOLN: INTRAVENOUS | Status: DC

## 2020-04-26 MED ORDER — ORAL CARE MOUTH RINSE
15.0000 mL | OROMUCOSAL | Status: DC
  Administered 2020-04-26 – 2020-04-28 (×18): 15 mL via OROMUCOSAL

## 2020-04-26 MED ORDER — MORPHINE SULFATE (PF) 2 MG/ML IV SOLN
2.0000 mg | INTRAVENOUS | Status: DC | PRN
  Administered 2020-04-28 (×2): 2 mg via INTRAVENOUS
  Filled 2020-04-26 (×3): qty 1

## 2020-04-26 MED ORDER — SODIUM CHLORIDE 0.9 % IV BOLUS
1000.0000 mL | Freq: Once | INTRAVENOUS | Status: AC
  Administered 2020-04-26: 1000 mL via INTRAVENOUS

## 2020-04-26 MED ORDER — PROPOFOL 1000 MG/100ML IV EMUL
5.0000 ug/kg/min | INTRAVENOUS | Status: DC
  Administered 2020-04-26 – 2020-04-27 (×4): 80 ug/kg/min via INTRAVENOUS
  Administered 2020-04-27 (×3): 70 ug/kg/min via INTRAVENOUS
  Administered 2020-04-28: 80 ug/kg/min via INTRAVENOUS
  Filled 2020-04-26 (×7): qty 100
  Filled 2020-04-26: qty 200
  Filled 2020-04-26 (×2): qty 100

## 2020-04-26 MED ORDER — ACETAMINOPHEN 325 MG PO TABS
650.0000 mg | ORAL_TABLET | Freq: Four times a day (QID) | ORAL | Status: DC | PRN
  Administered 2020-04-28 – 2020-04-29 (×3): 650 mg via ORAL
  Filled 2020-04-26 (×3): qty 2

## 2020-04-26 MED ORDER — FENTANYL CITRATE (PF) 250 MCG/5ML IJ SOLN
INTRAMUSCULAR | Status: DC | PRN
  Administered 2020-04-26: 25 ug via INTRAVENOUS
  Administered 2020-04-26 (×4): 50 ug via INTRAVENOUS
  Administered 2020-04-26: 25 ug via INTRAVENOUS

## 2020-04-26 MED ORDER — FENTANYL CITRATE (PF) 100 MCG/2ML IJ SOLN
INTRAMUSCULAR | Status: AC
  Filled 2020-04-26: qty 2

## 2020-04-26 MED ORDER — ROCURONIUM BROMIDE 10 MG/ML (PF) SYRINGE
PREFILLED_SYRINGE | INTRAVENOUS | Status: AC
  Filled 2020-04-26: qty 10

## 2020-04-26 MED ORDER — DEXMEDETOMIDINE HCL IN NACL 400 MCG/100ML IV SOLN: 0.0000 ug/kg/h | INTRAVENOUS | Status: DC

## 2020-04-26 MED ORDER — ZOLPIDEM TARTRATE 5 MG PO TABS: 5.0000 mg | ORAL_TABLET | Freq: Every evening | ORAL | Status: DC | PRN

## 2020-04-26 MED ORDER — PROPOFOL 10 MG/ML IV BOLUS
INTRAVENOUS | Status: DC | PRN
  Administered 2020-04-26: 120 mg via INTRAVENOUS
  Administered 2020-04-26 (×2): 20 mg via INTRAVENOUS
  Administered 2020-04-26: 10 mg via INTRAVENOUS

## 2020-04-26 MED ORDER — COCAINE HCL 4 % EX SOLN
4.0000 mL | Freq: Once | CUTANEOUS | Status: DC
  Filled 2020-04-26: qty 4

## 2020-04-26 MED ORDER — HYDROCODONE-ACETAMINOPHEN 7.5-325 MG PO TABS
1.0000 | ORAL_TABLET | Freq: Four times a day (QID) | ORAL | Status: DC | PRN
  Administered 2020-04-28 (×2): 1 via ORAL
  Filled 2020-04-26 (×3): qty 1

## 2020-04-26 MED ORDER — ONDANSETRON HCL 4 MG PO TABS: 4.0000 mg | ORAL_TABLET | Freq: Four times a day (QID) | ORAL | Status: DC | PRN

## 2020-04-26 MED ORDER — DEXAMETHASONE SODIUM PHOSPHATE 10 MG/ML IJ SOLN
INTRAMUSCULAR | Status: DC | PRN
  Administered 2020-04-26: 8 mg via INTRAVENOUS

## 2020-04-26 MED ORDER — HYDRALAZINE HCL 20 MG/ML IJ SOLN: 5.0000 mg | INTRAMUSCULAR | Status: DC | PRN

## 2020-04-26 MED ORDER — FENTANYL 2500MCG IN NS 250ML (10MCG/ML) PREMIX INFUSION
25.0000 ug/h | INTRAVENOUS | Status: DC
  Administered 2020-04-26: 50 ug/h via INTRAVENOUS
  Administered 2020-04-27: 100 ug/h via INTRAVENOUS
  Filled 2020-04-26 (×2): qty 250

## 2020-04-26 MED ORDER — OXYMETAZOLINE HCL 0.05 % NA SOLN
NASAL | Status: AC
  Filled 2020-04-26: qty 30

## 2020-04-26 MED ORDER — KETAMINE HCL 50 MG/5ML IJ SOSY
PREFILLED_SYRINGE | INTRAMUSCULAR | Status: AC
  Filled 2020-04-26: qty 5

## 2020-04-26 MED ORDER — KETAMINE HCL 10 MG/ML IJ SOLN
INTRAMUSCULAR | Status: DC | PRN
Start: 2020-04-26 — End: 2020-04-26
  Administered 2020-04-26: 40 mg via INTRAVENOUS
  Administered 2020-04-26: 10 mg via INTRAVENOUS

## 2020-04-26 MED ORDER — ONDANSETRON HCL 4 MG/2ML IJ SOLN: 4.0000 mg | Freq: Once | INTRAMUSCULAR | Status: DC | PRN

## 2020-04-26 MED ORDER — KETOROLAC TROMETHAMINE 15 MG/ML IJ SOLN
15.0000 mg | Freq: Once | INTRAMUSCULAR | Status: AC
  Administered 2020-04-26: 15 mg via INTRAVENOUS
  Filled 2020-04-26: qty 1

## 2020-04-26 MED ORDER — HYDROMORPHONE HCL 1 MG/ML IJ SOLN
0.5000 mg | Freq: Once | INTRAMUSCULAR | Status: AC
  Administered 2020-04-26: 0.5 mg via INTRAVENOUS
  Filled 2020-04-26: qty 1

## 2020-04-26 MED ORDER — LIDOCAINE 2% (20 MG/ML) 5 ML SYRINGE
INTRAMUSCULAR | Status: AC
  Filled 2020-04-26: qty 5

## 2020-04-26 MED ORDER — 0.9 % SODIUM CHLORIDE (POUR BTL) OPTIME
TOPICAL | Status: DC | PRN
  Administered 2020-04-26: 1000 mL

## 2020-04-26 MED ORDER — SUCCINYLCHOLINE CHLORIDE 200 MG/10ML IV SOSY
PREFILLED_SYRINGE | INTRAVENOUS | Status: AC
  Filled 2020-04-26: qty 10

## 2020-04-26 MED ORDER — ROCURONIUM BROMIDE 10 MG/ML (PF) SYRINGE
PREFILLED_SYRINGE | INTRAVENOUS | Status: AC
  Filled 2020-04-26: qty 20

## 2020-04-26 MED ORDER — CHLORHEXIDINE GLUCONATE CLOTH 2 % EX PADS
6.0000 | MEDICATED_PAD | Freq: Every day | CUTANEOUS | Status: DC
  Administered 2020-04-27 – 2020-04-29 (×3): 6 via TOPICAL

## 2020-04-26 MED ORDER — CHLORHEXIDINE GLUCONATE 0.12% ORAL RINSE (MEDLINE KIT)
15.0000 mL | Freq: Two times a day (BID) | OROMUCOSAL | Status: DC
  Administered 2020-04-26 – 2020-04-28 (×4): 15 mL via OROMUCOSAL

## 2020-04-26 MED ORDER — DEXAMETHASONE SODIUM PHOSPHATE 4 MG/ML IJ SOLN
4.0000 mg | Freq: Four times a day (QID) | INTRAMUSCULAR | Status: AC
  Administered 2020-04-26 – 2020-04-27 (×4): 4 mg via INTRAVENOUS
  Filled 2020-04-26 (×3): qty 1

## 2020-04-26 MED ORDER — INSULIN ASPART 100 UNIT/ML ~~LOC~~ SOLN
0.0000 [IU] | SUBCUTANEOUS | Status: DC
  Administered 2020-04-27 (×2): 1 [IU] via SUBCUTANEOUS

## 2020-04-26 MED ORDER — ACETAMINOPHEN 650 MG RE SUPP: 650.0000 mg | Freq: Four times a day (QID) | RECTAL | Status: DC | PRN

## 2020-04-26 MED ORDER — ONDANSETRON HCL 4 MG/2ML IJ SOLN
INTRAMUSCULAR | Status: DC | PRN
  Administered 2020-04-26: 4 mg via INTRAVENOUS

## 2020-04-26 MED ORDER — POLYETHYLENE GLYCOL 3350 17 G PO PACK: 17.0000 g | PACK | Freq: Every day | ORAL | Status: DC | PRN

## 2020-04-26 MED ORDER — FENTANYL CITRATE (PF) 100 MCG/2ML IJ SOLN: 50.0000 ug | Freq: Once | INTRAMUSCULAR | Status: DC

## 2020-04-26 MED ORDER — ONDANSETRON HCL 4 MG/2ML IJ SOLN
4.0000 mg | Freq: Four times a day (QID) | INTRAMUSCULAR | Status: DC | PRN
  Administered 2020-04-28: 4 mg via INTRAVENOUS
  Filled 2020-04-26: qty 2

## 2020-04-26 MED ORDER — MIDAZOLAM HCL 2 MG/2ML IJ SOLN
INTRAMUSCULAR | Status: AC
  Filled 2020-04-26: qty 2

## 2020-04-26 MED ORDER — LIDOCAINE-EPINEPHRINE 2 %-1:100000 IJ SOLN
INTRAMUSCULAR | Status: AC
  Filled 2020-04-26: qty 1

## 2020-04-26 MED ORDER — PROPOFOL 500 MG/50ML IV EMUL
INTRAVENOUS | Status: DC | PRN
  Administered 2020-04-26: 100 ug/kg/min via INTRAVENOUS

## 2020-04-26 MED ORDER — LACTATED RINGERS IV SOLN: INTRAVENOUS | Status: AC

## 2020-04-26 MED ORDER — FENTANYL BOLUS VIA INFUSION
50.0000 ug | INTRAVENOUS | Status: DC | PRN
Start: 2020-04-26 — End: 2020-04-26

## 2020-04-26 MED ORDER — DEXMEDETOMIDINE (PRECEDEX) IN NS 20 MCG/5ML (4 MCG/ML) IV SYRINGE
PREFILLED_SYRINGE | INTRAVENOUS | Status: DC | PRN
  Administered 2020-04-26: 4 ug via INTRAVENOUS
  Administered 2020-04-26: 8 ug via INTRAVENOUS
  Administered 2020-04-26: 12 ug via INTRAVENOUS
  Administered 2020-04-26 (×2): 8 ug via INTRAVENOUS

## 2020-04-26 SURGICAL SUPPLY — 60 items
BLADE SURG 15 STRL LF DISP TIS (BLADE) ×2 IMPLANT
BLADE SURG 15 STRL SS (BLADE) ×4
BNDG CONFORM 2 STRL LF (GAUZE/BANDAGES/DRESSINGS) ×4 IMPLANT
BUR CROSS CUT FISSURE 1.6 (BURR) ×3 IMPLANT
BUR CROSS CUT FISSURE 1.6MM (BURR) ×1
BUR EGG ELITE 4.0 (BURR) ×3 IMPLANT
BUR EGG ELITE 4.0MM (BURR) ×1
CANISTER SUCT 3000ML PPV (MISCELLANEOUS) ×4 IMPLANT
COVER SURGICAL LIGHT HANDLE (MISCELLANEOUS) ×8 IMPLANT
COVER WAND RF STERILE (DRAPES) ×4 IMPLANT
DECANTER SPIKE VIAL GLASS SM (MISCELLANEOUS) ×4 IMPLANT
DRAIN PENROSE 1/4X12 LTX STRL (WOUND CARE) ×4 IMPLANT
DRAPE U-SHAPE 76X120 STRL (DRAPES) ×4 IMPLANT
DRSG PAD ABDOMINAL 8X10 ST (GAUZE/BANDAGES/DRESSINGS) IMPLANT
ELECT COATED BLADE 2.86 ST (ELECTRODE) IMPLANT
ELECT REM PT RETURN 9FT ADLT (ELECTROSURGICAL) ×4
ELECTRODE REM PT RTRN 9FT ADLT (ELECTROSURGICAL) ×2 IMPLANT
GAUZE 4X4 16PLY RFD (DISPOSABLE) ×4 IMPLANT
GAUZE PACKING FOLDED 2  STR (GAUZE/BANDAGES/DRESSINGS) ×4
GAUZE PACKING FOLDED 2 STR (GAUZE/BANDAGES/DRESSINGS) ×2 IMPLANT
GAUZE SPONGE 4X4 12PLY STRL (GAUZE/BANDAGES/DRESSINGS) ×2 IMPLANT
GLOVE BIO SURGEON STRL SZ 6.5 (GLOVE) IMPLANT
GLOVE BIO SURGEON STRL SZ7 (GLOVE) IMPLANT
GLOVE BIO SURGEON STRL SZ8 (GLOVE) ×4 IMPLANT
GLOVE BIO SURGEONS STRL SZ 6.5 (GLOVE)
GLOVE BIOGEL PI IND STRL 6.5 (GLOVE) IMPLANT
GLOVE BIOGEL PI IND STRL 7.0 (GLOVE) IMPLANT
GLOVE BIOGEL PI INDICATOR 6.5 (GLOVE)
GLOVE BIOGEL PI INDICATOR 7.0 (GLOVE)
GOWN STRL REUS W/ TWL LRG LVL3 (GOWN DISPOSABLE) ×2 IMPLANT
GOWN STRL REUS W/ TWL XL LVL3 (GOWN DISPOSABLE) ×2 IMPLANT
GOWN STRL REUS W/TWL LRG LVL3 (GOWN DISPOSABLE) ×4
GOWN STRL REUS W/TWL XL LVL3 (GOWN DISPOSABLE) ×4
IV NS 1000ML (IV SOLUTION) ×4
IV NS 1000ML BAXH (IV SOLUTION) ×2 IMPLANT
KIT BASIN OR (CUSTOM PROCEDURE TRAY) ×4 IMPLANT
KIT TURNOVER KIT B (KITS) ×4 IMPLANT
MARKER SKIN DUAL TIP RULER LAB (MISCELLANEOUS) ×4 IMPLANT
NDL HYPO 25GX1X1/2 BEV (NEEDLE) ×4 IMPLANT
NEEDLE HYPO 25GX1X1/2 BEV (NEEDLE) ×8 IMPLANT
NS IRRIG 1000ML POUR BTL (IV SOLUTION) ×4 IMPLANT
PACK SURGICAL SETUP 50X90 (CUSTOM PROCEDURE TRAY) ×4 IMPLANT
PAD ARMBOARD 7.5X6 YLW CONV (MISCELLANEOUS) ×8 IMPLANT
PENCIL BUTTON HOLSTER BLD 10FT (ELECTRODE) IMPLANT
POSITIONER HEAD DONUT 9IN (MISCELLANEOUS) IMPLANT
SLEEVE IRRIGATION ELITE 7 (MISCELLANEOUS) ×4 IMPLANT
SPONGE SURGIFOAM ABS GEL 12-7 (HEMOSTASIS) IMPLANT
SUT CHROMIC 3 0 PS 2 (SUTURE) ×4 IMPLANT
SUT ETHILON 2 0 FS 18 (SUTURE) ×4 IMPLANT
SWAB COLLECTION DEVICE MRSA (MISCELLANEOUS) ×4 IMPLANT
SWAB CULTURE ESWAB REG 1ML (MISCELLANEOUS) ×4 IMPLANT
SYR BULB IRRIG 60ML STRL (SYRINGE) ×4 IMPLANT
SYR CONTROL 10ML LL (SYRINGE) ×4 IMPLANT
TRAY ENT MC OR (CUSTOM PROCEDURE TRAY) ×4 IMPLANT
TUBE CONNECTING 12'X1/4 (SUCTIONS) ×1
TUBE CONNECTING 12X1/4 (SUCTIONS) ×3 IMPLANT
TUBING IRRIGATION (MISCELLANEOUS) ×4 IMPLANT
VALVE BIOPSY  SINGLE USE (MISCELLANEOUS) ×8
VALVE BIOPSY SINGLE USE (MISCELLANEOUS) IMPLANT
YANKAUER SUCT BULB TIP NO VENT (SUCTIONS) ×4 IMPLANT

## 2020-04-26 NOTE — Transfer of Care (Signed)
Immediate Anesthesia Transfer of Care Note  Patient: OMEGA DURANTE  Procedure(s) Performed: DENTAL RESTORATION/EXTRACTIONS TOOTH #31 (N/A ) INCISION AND DRAINAGE ABSCESS RIGHT NECK (Right )  Patient Location: PACU  Anesthesia Type:General  Level of Consciousness: sedated and Patient remains intubated per anesthesia plan  Airway & Oxygen Therapy: Patient Spontanous Breathing, Patient remains intubated per anesthesia plan and Patient placed on Ventilator (see vital sign flow sheet for setting)  Post-op Assessment: Report given to RN, Post -op Vital signs reviewed and stable and Patient moving all extremities X 4  Post vital signs: Reviewed and stable  Last Vitals:  Vitals Value Taken Time  BP 101/52 04/26/20 1902  Temp 37.3 C 04/26/20 1845  Pulse 80 04/26/20 1908  Resp 42 04/26/20 1908  SpO2 100 % 04/26/20 1908  Vitals shown include unvalidated device data.  Last Pain:  Vitals:   04/26/20 1845  TempSrc:   PainSc: 0-No pain         Complications: No complications documented.

## 2020-04-26 NOTE — ED Provider Notes (Signed)
[facility]    CSN: [id number] Arrival date & time: 04/26/20  0803      History   Chief Complaint Chief Complaint  Patient presents with  . Dental Pain    HPI Amber King is a 39 y.o. female.   Patient is a 39 year old female presents today with right lower dental pain, significant facial swelling.  This is been worsening over the past 2 and half weeks.  Seen here Friday for same and has been taking antibiotics as prescribed but the swelling is worsening.  Denies any drainage.  Unable to take the pain medicine she was prescribed due to vomiting.  Denies any specific fevers.  Took Advil at 6:00 this morning.     Past Medical History:  Diagnosis Date  . Calf pain   . Panic attacks   . PCOS (polycystic ovarian syndrome)   . Peptic ulcer disease     Patient Active Problem List   Diagnosis Date Noted  . Pain in limb 07/28/2011    History reviewed. No pertinent surgical history.  OB History   No obstetric history on file.      Home Medications    Prior to Admission medications   Medication Sig Start Date End Date Taking? Authorizing Provider  amoxicillin-clavulanate (AUGMENTIN) 875-125 MG tablet Take 1 tablet by mouth every 12 (twelve) hours. 04/23/20  Yes Eustace Moore, MD  HYDROcodone-acetaminophen (NORCO) 7.5-325 MG tablet Take 1 tablet by mouth every 6 (six) hours as needed for moderate pain. 04/23/20  Yes Eustace Moore, MD  metroNIDAZOLE (FLAGYL) 500 MG tablet Take 1 tablet (500 mg total) by mouth 2 (two) times daily. 04/23/20  Yes Eustace Moore, MD  valACYclovir (VALTREX) 1000 MG tablet Take 1 tablet (1,000 mg total) by mouth 3 (three) times daily. 01/11/16   Devoria Albe, MD  clonazePAM (KLONOPIN) 0.5 MG tablet Take 0.5 mg by mouth at bedtime as needed.    04/23/20  [provider]  FLUoxetine (PROZAC) 40 MG capsule Take 40 mg by mouth daily.    04/23/20  [provider]  gabapentin (NEURONTIN) 300 MG capsule Take 300 mg by  mouth 2 (two) times daily.    04/23/20  [provider]  ranitidine (ZANTAC) 150 MG capsule Take 150 mg by mouth 2 (two) times daily.    04/23/20  [provider]    Family History Family History  Problem Relation Age of Onset  . Healthy Mother   . Healthy Father     Social History Social History   Tobacco Use  . Smoking status: Current Every Day Smoker    Packs/day: 0.50    Types: Cigarettes  . Smokeless tobacco: Never Used  Vaping Use  . Vaping Use: Never used  Substance Use Topics  . Alcohol use: Not Currently    Alcohol/week: 2.0 standard drinks    Types: 2 Standard drinks or equivalent per week  . Drug use: No     Allergies   Patient has no known allergies.   Review of Systems Review of Systems   Physical Exam Triage Vital Signs ED Triage Vitals  Enc Vitals Group     BP 04/26/20 0818 134/80     Pulse Rate 04/26/20 0818 (!) 102     Resp 04/26/20 0818 16     Temp 04/26/20 0818 97.8 F (36.6 C)     Temp Source 04/26/20 0818 Oral     SpO2 04/26/20 0818 98 %     Weight --  Height --      Head Circumference --      Peak Flow --      Pain Score 04/26/20 0819 9     Pain Loc --      Pain Edu? --      Excl. in GC? --    No data found.  Updated Vital Signs BP 134/80 (BP Location: Right Arm)   Pulse (!) 102   Temp 97.8 F (36.6 C) (Oral)   Resp 16   LMP 04/09/2020   SpO2 98%   Visual Acuity Right Eye Distance:   Left Eye Distance:   Bilateral Distance:    Right Eye Near:   Left Eye Near:    Bilateral Near:     Physical Exam Vitals and nursing note reviewed.  Constitutional:      General: She is not in acute distress.    Appearance: Normal appearance. She is not ill-appearing, toxic-appearing or diaphoretic.  HENT:     Head: Normocephalic.     Nose: Nose normal.     Mouth/Throat:     Comments: Significant right lower facial swelling with mild trismus.  Eyes:     Conjunctiva/sclera: Conjunctivae normal.  Pulmonary:      Effort: Pulmonary effort is normal.  Musculoskeletal:        General: Normal range of motion.     Cervical back: Normal range of motion.  Skin:    General: Skin is warm and dry.     Findings: No rash.  Neurological:     Mental Status: She is alert.  Psychiatric:        Mood and Affect: Mood normal.      UC Treatments / Results  Labs (all labs ordered are listed, but only abnormal results are displayed) Labs Reviewed - No data to display  EKG   Radiology No results found.  Procedures Procedures (including critical care time)  Medications Ordered in UC Medications - No data to display  Initial Impression / Assessment and Plan / UC Course  I have reviewed the triage vital signs and the nursing notes.  Pertinent labs & imaging results that were available during my care of the patient were reviewed by me and considered in my medical decision making (see chart for details).     Dental abscess Patient with significant facial swelling and mild trismus.  She is failing outpatient antibiotics.  We will go ahead and send to the ER for further management Final Clinical Impressions(s) / UC Diagnoses   Final diagnoses:  Dental abscess     Discharge Instructions     Please go to the ER for further evaluation    ED Prescriptions    None     I have reviewed the PDMP during this encounter.   Dahlia Byes A, NP 04/26/20 [phone]

## 2020-04-26 NOTE — Anesthesia Preprocedure Evaluation (Addendum)
Anesthesia Evaluation  Patient identified by MRN, date of birth, ID band Patient awake    Reviewed: Allergy & Precautions, NPO status , Patient's Chart, lab work & pertinent test results  Airway    Neck ROM: Limited  Mouth opening: Limited Mouth Opening Comment: R. Submandibular abscess limited mouth opening Dental  (+) Teeth Intact, Dental Advisory Given   Pulmonary Current Smoker,  Current smoker, 12.5 pack year history    breath sounds clear to auscultation       Cardiovascular negative cardio ROS   Rhythm:Regular Rate:Normal     Neuro/Psych PSYCHIATRIC DISORDERS Anxiety negative neurological ROS     GI/Hepatic Neg liver ROS, PUD,   Endo/Other  negative endocrine ROS  Renal/GU negative Renal ROS   PCOS    Musculoskeletal negative musculoskeletal ROS (+)   Abdominal   Peds  Hematology negative hematology ROS (+)   Anesthesia Other Findings Right submandibular abscess  Reproductive/Obstetrics negative OB ROS                            Anesthesia Physical Anesthesia Plan  ASA: IV  Anesthesia Plan: General   Post-op Pain Management:    Induction:   PONV Risk Score and Plan:   Airway Management Planned: Nasal ETT  Additional Equipment:   Intra-op Plan:   Post-operative Plan: Possible Post-op intubation/ventilation  Informed Consent: I have reviewed the patients History and Physical, chart, labs and discussed the procedure including the risks, benefits and alternatives for the proposed anesthesia with the patient or authorized representative who has indicated his/her understanding and acceptance.     Dental advisory given  Plan Discussed with: CRNA and Anesthesiologist  Anesthesia Plan Comments:         Anesthesia Quick Evaluation

## 2020-04-26 NOTE — ED Triage Notes (Signed)
Pt c/o dental pain to lower right tooth/teeth for approx 2.5 weeks. Pt states she was evaluated and tx here on Friday for same and that "antibiotics" are not helping and reports swelling is increasing to underneath mandible area. Denies making appt with DDS yet. Pt states the pain relievers have been "making me nauseous and throw up".  Denies fevers. Right mandible with significant edema, localized area of erythema. Advil dual action last taken at 0600

## 2020-04-26 NOTE — Consult Note (Signed)
Reason for Consult: Right jaw swelling Referring Physician: Jonah Blue, MD  Amber King is an 39 y.o. female.  CC: Right jaw swelling.    HPI: Dental pain started several weeks ago. Condition worsened recently. Seen at urgent care 04/23/2020 and placed on Flagyl and Augmentin. To ER today unable to open mouth  Past Medical History:  Diagnosis Date  . Panic attacks   . PCOS (polycystic ovarian syndrome)   . Peptic ulcer disease     Past Surgical History:  Procedure Laterality Date  . MOUTH SURGERY      Family History  Problem Relation Age of Onset  . Healthy Mother   . Healthy Father     Social History:  reports that she has been smoking cigarettes. She has a 12.50 pack-year smoking history. She has never used smokeless tobacco. She reports previous alcohol use of about 2.0 standard drinks of alcohol per week. She reports that she does not use drugs.  Allergies: No Known Allergies  Medications: I have reviewed the patient's current medications.  Results for orders placed or performed during the hospital encounter of 04/26/20 (from the past 48 hour(s))  Basic metabolic panel     Status: Abnormal   Collection Time: 04/26/20  9:59 AM  Result Value Ref Range   Sodium 137 135 - 145 mmol/L   Potassium 3.1 (L) 3.5 - 5.1 mmol/L   Chloride 99 98 - 111 mmol/L   CO2 26 22 - 32 mmol/L   Glucose, Bld 97 70 - 99 mg/dL    Comment: Glucose reference range applies only to samples taken after fasting for at least 8 hours.   BUN 6 6 - 20 mg/dL   Creatinine, Ser 0.86 0.44 - 1.00 mg/dL   Calcium 8.9 8.9 - 76.1 mg/dL   GFR calc non Af Amer >60 >60 mL/min   GFR calc Af Amer >60 >60 mL/min   Anion gap 12 5 - 15    Comment: Performed at [facility], [address] [place], [place] [address]  CBC with Differential     Status: Abnormal   Collection Time: 04/26/20  9:59 AM  Result Value Ref Range   WBC 15.3 (H) 4.0 - 10.5 K/uL   RBC 4.50 3.87 - 5.11 MIL/uL   Hemoglobin 13.6  12.0 - 15.0 g/dL   HCT 26.7 36 - 46 %   MCV 91.1 80.0 - 100.0 fL   MCH 30.2 26.0 - 34.0 pg   MCHC 33.2 30.0 - 36.0 g/dL   RDW 12.4 58.0 - 99.8 %   Platelets 281 150 - 400 K/uL   nRBC 0.0 0.0 - 0.2 %   Neutrophils Relative % 79 %   Neutro Abs 12.1 (H) 1.7 - 7.7 K/uL   Lymphocytes Relative 13 %   Lymphs Abs 2.1 0.7 - 4.0 K/uL   Monocytes Relative 7 %   Monocytes Absolute 1.0 0 - 1 K/uL   Eosinophils Relative 0 %   Eosinophils Absolute 0.0 0 - 0 K/uL   Basophils Relative 0 %   Basophils Absolute 0.0 0 - 0 K/uL   Immature Granulocytes 1 %   Abs Immature Granulocytes 0.07 0.00 - 0.07 K/uL    Comment: Performed at [facility], [address], [place], [place] [address]  I-Stat beta hCG blood, ED     Status: None   Collection Time: 04/26/20 10:03 AM  Result Value Ref Range   I-stat hCG, quantitative <5.0 <5 mIU/mL  Comment 3            Comment:   GEST. AGE      CONC.  (mIU/mL)   <=1 WEEK        5 - 50     2 WEEKS       50 - 500     3 WEEKS       100 - 10,000     4 WEEKS     1,000 - 30,000        FEMALE AND NON-PREGNANT FEMALE:     LESS THAN 5 mIU/mL     CT Maxillofacial Wo Contrast  Result Date: 04/26/2020 CLINICAL DATA:  Right jaw swelling and pain EXAM: CT MAXILLOFACIAL WITHOUT CONTRAST TECHNIQUE: Multidetector CT imaging of the maxillofacial structures was performed. Multiplanar CT image reconstructions were also generated. COMPARISON:  None. FINDINGS: Osseous: There is poor dentition with significant dental decay. Most notably, there is a grossly carious middle right mandibular molar with significant periapical lucency and dehiscence of the underlying cortex including the lingual surface. Temporomandibular joints are unremarkable. Partially calcified right foraminal protrusion extending into the C5-C6 neural foramen. Orbits: Unremarkable. Sinuses: Minor mucosal thickening. Soft tissues: Contiguous with aforementioned dental disease, there is extensive soft tissue swelling  about the body of the mandible on the right extending into the submandibular space. Limited intracranial: No acute abnormality. IMPRESSION: Significant dental decay including carious middle right mandibular molar with periapical lucency and dehiscence of cortex. Extensive adjacent soft tissue swelling about the body of the mandible extending into the submandibular space. A low-attenuation fluid collection is not identified, but an abscess is difficult to completely exclude on this noncontrast study. Electronically Signed   By: Guadlupe Spanish M.D.   On: 04/26/2020 10:43    ROS Blood pressure (!) 108/55, pulse 64, temperature 98.8 F (37.1 C), temperature source Oral, resp. rate 16, height 5\' 4"  (1.626 m), weight 49.9 kg, last menstrual period 04/09/2020, SpO2 100 %. General appearance: alert, cooperative and no distress Head: Normocephalic, without obvious abnormality, atraumatic Eyes: negative Nose: Nares normal. Septum midline. Mucosa normal. No drainage or sinus tenderness. Throat: lips, mucosa, and tongue normal; teeth and gums normal and Maximum opening  approx 1 cm. Unable to visualize adequately due to inability to opoen mouth wide. Carioius tooth lower right molar.  Neck: no adenopathy and Severe edema right lateral jaw with fluctuance. Edema extends into submandibular area.   Assessment/Plan: Right submandibular space infection secondary to dental abscess. Plan Dental extraction and Incision and drainage right neck with GA today.  04/11/2020 04/26/2020, 1:45 PM

## 2020-04-26 NOTE — Consult Note (Signed)
Anesthesiology airway note:  Amber King is a 39 year old female with poor dentition  who presented with a severe R. submandibular  abscess.  She is scheduled to undergo I&D of right submandibular space by Dr. Barbette Merino.  On examination she had marked  swelling and brawny edema of her right submandibular area and was unable to open her mouth more than 2 cm.  The decision was made to proceed with awake nasal fiberoptic intubation.  The right nares was packed with 4% cocaine soaked  gauze. She was then taken to the operating room and given a total of 2 mg of Versed 100 mcg  of fentanyl, 50 mg of ketamine, and 80 mcg of dexmedetomidine.  She was then anesthetized with bilateral topical  superior laryngeal nerve blocks using 4% lidocaine soaked cotton balls.  The right nares was dilated with nasal trumpets and a 6.5 mm  endotracheal tube was inserted into the right nares.  With fiberoptic guidance the endotracheal tube was inserted through the vocal cords into the trachea and the position was checked with fiberoptic bronchoscopy.  The tube was then secured at the right nares.  There were bilateral breath sounds present and a good end-tidal CO2 waveform.  Her oxygen saturation maintained between 98 and 100% during the entire intubation procedure.  Due to her upper airway edema and limited mouth opening, it was elected to keep the patient intubated overnight following surgery.  Kipp Brood, MD

## 2020-04-26 NOTE — Consult Note (Signed)
NAME:  HANSIKA LEAMING, MRN:  [id number], DOB:  07-08-1981, LOS: 0 ADMISSION DATE:  04/26/2020, CONSULTATION DATE:  04/26/20 REFERRING MD:  Jonah Blue MD, CHIEF COMPLAINT:  Intubation   Brief History   JINNIFER MONTEJANO is a 40 y.o. female with right submandibular space infection 2/2 dental abscess requiring urgent operative intervention after patient unable to open mouth on day of presentation despite preceding antibiotic therapy. Patient underwent awake fiberoptic nasal intubation and underwent I&D of right neck on 8/9.  History of present illness   Karrah Mangini Menden is a 39 y.o. female with no significant medical history who presented from home after multiple days of antibiotic therapy (Augmentin, Flagyl) for a dental abscess with inability to open her mouth. She underwent dental extraction and I&D and was nasally intubated via awake fiberoptic approach. ETT was maintained post-intubation given the severity of neck swelling noted pre-operatively. After arriving in PACU, she developed large cuff leak with suspected pilot balloon malfunction as the CXR showed appropriate positioning of the ETT. She was subsequently orally intubated without complication. Visible posterior pharynx did not appear significantly edematous post-operatively and the procedure was completed successfully by Anesthesia. PCCM consulted for ventilator management post-operatively.  Past Medical History  PCOS PUD  Significant Hospital Events   Nasal > oral ETT exchange (8/9)  Consults:  PCCM   Procedures:  Dental extraction with I&D (8/9)  Significant Diagnostic Tests:  N/A  Micro Data:  Tissue culture (8/9) pending  Antimicrobials:  Unasyn, vancomycin (8/9-present) Ceftriaxone (8/9) Augmentin, Flagyl (8/6-8/9)   Interim history/subjective:  N/A  Objective   Blood pressure (!) 101/52, pulse 74, temperature (P) 99.1 F (37.3 C), resp. rate 16, height 5\' 4"  (1.626 m), weight 49.9 kg, last menstrual period  04/13/2020, SpO2 100 %.    Vent Mode: PRVC FiO2 (%):  [60 %] 60 % Set Rate:  [16 bmp] 16 bmp Vt Set:  [430 mL] 430 mL PEEP:  [5 cmH20] 5 cmH20 Plateau Pressure:  [11 cmH20-13 cmH20] 13 cmH20   Intake/Output Summary (Last 24 hours) at 04/26/2020 2034 Last data filed at 04/26/2020 1758 Gross per 24 hour  Intake 1700 ml  Output --  Net 1700 ml   Filed Weights   04/26/20 0847  Weight: 49.9 kg    Examination: General: thin Caucasian female, intubated and sedated HENT: orally intubated, packing on R mandibular area with swelling Lungs: CTA b/l, no W/C/R Cardiovascular: RRR, no M/R/G Abdomen: soft, NTND, NABS Extremities: no C/C/E, no rash Neuro: RASS -4, sedated, not following commands GU: deferred  Resolved Hospital Problem list   N/A  Assessment & Plan:  1. Right submandibular space infection 2/2 dental abscess 2. Airway compromise requiring intubation 3. Critical airway  - Tube exchange successful - Target VT 6-8 cc/kg/IBW - RASS goal -2 overnight; propofol, fentanyl gtt, PRN fentanyl pushes - Continue Unasyn, vancomycin - Follow up OR cultures - Hold off on OGT for now as anticipate extubation in AM  Best practice:  Diet: NPO Pain/Anxiety/Delirium protocol (if indicated): PADS VAP protocol (if indicated): Chlorhexidine DVT prophylaxis: Lovenox GI prophylaxis: PPI Glucose control: SSI Mobility: Ad lib Code Status: Full Family Communication: Vonda, Harth (Daughter) Disposition: Transfer to ICU  Labs   CBC: Recent Labs  Lab 04/26/20 0959  WBC 15.3*  NEUTROABS 12.1*  HGB 13.6  HCT 41.0  MCV 91.1  PLT 281    Basic Metabolic Panel: Recent Labs  Lab 04/26/20 0959  NA 137  K 3.1*  CL 99  CO2  26  GLUCOSE 97  BUN 6  CREATININE 0.64  CALCIUM 8.9   GFR: Estimated Creatinine Clearance: 74.4 mL/min (by C-G formula based on SCr of 0.64 mg/dL). Recent Labs  Lab 04/26/20 0959  WBC 15.3*    Liver Function Tests: No results for input(s): AST, ALT,  ALKPHOS, BILITOT, PROT, ALBUMIN in the last 168 hours. No results for input(s): LIPASE, AMYLASE in the last 168 hours. No results for input(s): AMMONIA in the last 168 hours.  ABG No results found for: PHART, PCO2ART, PO2ART, HCO3, TCO2, ACIDBASEDEF, O2SAT   Coagulation Profile: No results for input(s): INR, PROTIME in the last 168 hours.  Cardiac Enzymes: No results for input(s): CKTOTAL, CKMB, CKMBINDEX, TROPONINI in the last 168 hours.  HbA1C: No results found for: HGBA1C  CBG: No results for input(s): GLUCAP in the last 168 hours.  Review of Systems:   Unable to obtain due to patient status.  Past Medical History  She,  has a past medical history of Panic attacks, PCOS (polycystic ovarian syndrome), and Peptic ulcer disease.   Surgical History    Past Surgical History:  Procedure Laterality Date  . MOUTH SURGERY       Social History   reports that she has been smoking cigarettes. She has a 12.50 pack-year smoking history. She has never used smokeless tobacco. She reports previous alcohol use of about 2.0 standard drinks of alcohol per week. She reports that she does not use drugs.   Family History   Her family history includes Healthy in her father and mother.   Allergies No Known Allergies   Home Medications  Prior to Admission medications   Medication Sig Start Date End Date Taking? Authorizing Provider  amoxicillin-clavulanate (AUGMENTIN) 875-125 MG tablet Take 1 tablet by mouth every 12 (twelve) hours. 04/23/20  Yes Eustace Moore, MD  HYDROcodone-acetaminophen (NORCO) 7.5-325 MG tablet Take 1 tablet by mouth every 6 (six) hours as needed for moderate pain. 04/23/20  Yes Eustace Moore, MD  metroNIDAZOLE (FLAGYL) 500 MG tablet Take 1 tablet (500 mg total) by mouth 2 (two) times daily. 04/23/20  Yes Eustace Moore, MD  clonazePAM (KLONOPIN) 0.5 MG tablet Take 0.5 mg by mouth at bedtime as needed.    04/23/20  [provider]  FLUoxetine (PROZAC) 40  MG capsule Take 40 mg by mouth daily.    04/23/20  [provider]  gabapentin (NEURONTIN) 300 MG capsule Take 300 mg by mouth 2 (two) times daily.    04/23/20  [provider]  ranitidine (ZANTAC) 150 MG capsule Take 150 mg by mouth 2 (two) times daily.    04/23/20  [provider]     Critical care time: 40 minutes.

## 2020-04-26 NOTE — ED Provider Notes (Signed)
[facility] EMERGENCY DEPARTMENT Provider Note   CSN: [id number] Arrival date & time: 04/26/20  0840     History Chief Complaint  Patient presents with  . Dental Problem  . Abscess    Amber King is a 39 y.o. female.  HPI    Patient presents for medical care for the third time in 72 hours.  She states that she is generally well, was so until about 4 days ago when she noticed pain, swelling in the right mandibular area. Since that time she has had increased discomfort, sore, severe, worse with attempts at eating. Patient has known poor teeth in that area, but has not been able to see a dentist. 3 days ago after being seen in urgent care she was started on antibiotics.  She now presented again to urgent care earlier today with concern for worsening pain, swelling, one episode of vomiting. After being seen at urgent care she was sent here for evaluation. She notes 1 interval episode of vomiting, as above, but no persistent nausea, no fever, no confusion, no chest pain, dyspnea. She has been attempting to take her medication as directed, but trismus is making p.o. intake difficult. History obtained by the patient and chart review.  Past Medical History:  Diagnosis Date  . Calf pain   . Panic attacks   . PCOS (polycystic ovarian syndrome)   . Peptic ulcer disease     Patient Active Problem List   Diagnosis Date Noted  . Pain in limb 07/28/2011    No past surgical history on file.   OB History   No obstetric history on file.     Family History  Problem Relation Age of Onset  . Healthy Mother   . Healthy Father     Social History   Tobacco Use  . Smoking status: Current Every Day Smoker    Packs/day: 0.50    Types: Cigarettes  . Smokeless tobacco: Never Used  Vaping Use  . Vaping Use: Never used  Substance Use Topics  . Alcohol use: Not Currently    Alcohol/week: 2.0 standard drinks    Types: 2 Standard drinks or equivalent per week    . Drug use: No    Home Medications Prior to Admission medications   Medication Sig Start Date End Date Taking? Authorizing Provider  amoxicillin-clavulanate (AUGMENTIN) 875-125 MG tablet Take 1 tablet by mouth every 12 (twelve) hours. 04/23/20  Yes Eustace Moore, MD  HYDROcodone-acetaminophen (NORCO) 7.5-325 MG tablet Take 1 tablet by mouth every 6 (six) hours as needed for moderate pain. 04/23/20  Yes Eustace Moore, MD  metroNIDAZOLE (FLAGYL) 500 MG tablet Take 1 tablet (500 mg total) by mouth 2 (two) times daily. 04/23/20  Yes Eustace Moore, MD  valACYclovir (VALTREX) 1000 MG tablet Take 1 tablet (1,000 mg total) by mouth 3 (three) times daily. Patient not taking: Reported on 04/26/2020 01/11/16   Devoria Albe, MD  clonazePAM (KLONOPIN) 0.5 MG tablet Take 0.5 mg by mouth at bedtime as needed.    04/23/20  [provider]  FLUoxetine (PROZAC) 40 MG capsule Take 40 mg by mouth daily.    04/23/20  [provider]  gabapentin (NEURONTIN) 300 MG capsule Take 300 mg by mouth 2 (two) times daily.    04/23/20  [provider]  ranitidine (ZANTAC) 150 MG capsule Take 150 mg by mouth 2 (two) times daily.    04/23/20  [provider]    Allergies  Patient has no known allergies.  Review of Systems   Review of Systems  Constitutional:       Per HPI, otherwise negative  HENT:       Per HPI, otherwise negative  Respiratory:       Per HPI, otherwise negative  Cardiovascular:       Per HPI, otherwise negative  Gastrointestinal: Positive for vomiting.  Endocrine:       Negative aside from HPI  Genitourinary:       Neg aside from HPI   Musculoskeletal:       Per HPI, otherwise negative  Skin: Negative.   Neurological: Negative for syncope.    Physical Exam Updated Vital Signs BP 129/81 (BP Location: Right Arm)   Pulse 84   Temp 98.8 F (37.1 C) (Oral)   Resp 16   Ht 5\' 4"  (1.626 m)   Wt 49.9 kg   LMP 04/09/2020   SpO2 100%   BMI 18.88 kg/m    Physical Exam Vitals and nursing note reviewed.  Constitutional:      Appearance: She is well-developed.     Comments: Uncomfortable appearing thin adult female sitting upright  HENT:     Head:      Comments: Cannot visualize oropharynx secondary to trismus. Eyes:     Conjunctiva/sclera: Conjunctivae normal.  Cardiovascular:     Rate and Rhythm: Normal rate and regular rhythm.     Pulses: Normal pulses.  Pulmonary:     Effort: Pulmonary effort is normal. No respiratory distress.     Breath sounds: No stridor.  Abdominal:     General: There is no distension.  Skin:    General: Skin is warm and dry.  Neurological:     Mental Status: She is alert and oriented to person, place, and time.     Cranial Nerves: No cranial nerve deficit.     ED Results / Procedures / Treatments   Labs (all labs ordered are listed, but only abnormal results are displayed) Labs Reviewed  BASIC METABOLIC PANEL - Abnormal; Notable for the following components:      Result Value   Potassium 3.1 (*)    All other components within normal limits  CBC WITH DIFFERENTIAL/PLATELET - Abnormal; Notable for the following components:   WBC 15.3 (*)    Neutro Abs 12.1 (*)    All other components within normal limits  SARS CORONAVIRUS 2 BY RT PCR (HOSPITAL ORDER, PERFORMED IN [facility])  I-STAT BETA HCG BLOOD, ED ([facility], [facility], [facility] ONLY)    EKG None  Radiology CT Maxillofacial Wo Contrast  Result Date: 04/26/2020 CLINICAL DATA:  Right jaw swelling and pain EXAM: CT MAXILLOFACIAL WITHOUT CONTRAST TECHNIQUE: Multidetector CT imaging of the maxillofacial structures was performed. Multiplanar CT image reconstructions were also generated. COMPARISON:  None. FINDINGS: Osseous: There is poor dentition with significant dental decay. Most notably, there is a grossly carious middle right mandibular molar with significant periapical lucency and dehiscence of the underlying cortex including the lingual surface.  Temporomandibular joints are unremarkable. Partially calcified right foraminal protrusion extending into the C5-C6 neural foramen. Orbits: Unremarkable. Sinuses: Minor mucosal thickening. Soft tissues: Contiguous with aforementioned dental disease, there is extensive soft tissue swelling about the body of the mandible on the right extending into the submandibular space. Limited intracranial: No acute abnormality. IMPRESSION: Significant dental decay including carious middle right mandibular molar with periapical lucency and dehiscence of cortex. Extensive adjacent soft tissue swelling about the body of the  mandible extending into the submandibular space. A low-attenuation fluid collection is not identified, but an abscess is difficult to completely exclude on this noncontrast study. Electronically Signed   By: Guadlupe Spanish M.D.   On: 04/26/2020 10:43    Procedures Procedures (including critical care time)  Medications Ordered in ED Medications  vancomycin (VANCOCIN) IVPB 1000 mg/200 mL premix (has no administration in time range)  ketorolac (TORADOL) 15 MG/ML injection 15 mg (has no administration in time range)  sodium chloride 0.9 % bolus 500 mL (500 mLs Intravenous New Bag/Given 04/26/20 1005)  HYDROmorphone (DILAUDID) injection 0.5 mg (0.5 mg Intravenous Given 04/26/20 0955)    ED Course  I have reviewed the triage vital signs and the nursing notes.  Pertinent labs & imaging results that were available during my care of the patient were reviewed by me and considered in my medical decision making (see chart for details).    MDM Rules/Calculators/A&P                          11:03 AM On repeat exam patient is awake, alert.  She continues to complain of discomfort. We discussed findings, with female companion present. Patient found to have substantial inflammatory changes about the right mandible, dental decay, and with superficial erythema, induration, leukocytosis, in the context of ongoing  outpatient oral therapy, there is concern for failure to respond to outpatient antibiotics for facial infection, receive IV vancomycin, require admission for further monitoring, management.  No medially identified drainable abscess.   Final Clinical Impression(s) / ED Diagnoses Final diagnoses:  Facial cellulitis  Dental infection     Gerhard Munch, MD 04/26/20 1106

## 2020-04-26 NOTE — Progress Notes (Signed)
Transported pt from PACU to 4N19. Pt tolerated well. Suctioned mouth and airway.

## 2020-04-26 NOTE — ED Triage Notes (Signed)
Pt. Stated, I have a bad tooth bottom right that's been bad for qa month. Went to UC given antibiotics and something for pain on Friday and its no better and they sent me down here.

## 2020-04-26 NOTE — Progress Notes (Signed)
eLink Physician-Brief Progress Note Patient Name: KEYARRA RENDALL DOB: [birth year] MRN: [id number]   Date of Service  04/26/2020  HPI/Events of Note  Hypotension - BP = 83/59 with MAP = 60.   eICU Interventions  Plan: 1. Bolus with 0.9 NaCl 1 liter IV over 1 hour now.      Intervention Category Major Interventions: Hypotension - evaluation and management  Zenab Gronewold Eugene 04/26/2020, 11:14 PM

## 2020-04-26 NOTE — ED Notes (Signed)
Pt transported to CT ?

## 2020-04-26 NOTE — H&P (Addendum)
History and Physical    Amber King PXT:[id number] DOB: Mar 03, 1981 DOA: 04/26/2020  PCP: Maurice Small, MD Consultants:  None Patient coming from:  Home - lives with husband and 2 children; NOK: Husband, [phone]  Chief Complaint: dental pain  HPI: Amber King is a 39 y.o. female with medical history significant of PCOS and panic d/o presenting with dental abscess.  She was seen on 8/6 at Urgent Care and given Flagyl and Augmentin.  She returned to UC this AM and so was sent to the ER.  She reports severe R mouth swelling, unable to open her mouth and it spread under her chin and toward her throat.  +headache.  Pain started a few weeks ago intermittently.  The swelling started but it would not go away so she went to [facility] on Friday.  She has been compliant with antibiotics but did not improve.  No fevers.   ED Course: Failed outpatient antibiotics for facial cellulitis.  No acute dental issue that needs drainage.  On Keflex since Friday, worsening despite abx.  Given Vanc.  Review of Systems: As per HPI; otherwise review of systems reviewed and negative.   Ambulatory Status:  Ambulates without assistance  COVID Vaccine Status:  None   Past Medical History:  Diagnosis Date  . Panic attacks   . PCOS (polycystic ovarian syndrome)   . Peptic ulcer disease     Past Surgical History:  Procedure Laterality Date  . MOUTH SURGERY      Social History   Socioeconomic History  . Marital status: Divorced    Spouse name: Not on file  . Number of children: Not on file  . Years of education: Not on file  . Highest education level: Not on file  Occupational History  . Occupation: Conservation officer, nature  Tobacco Use  . Smoking status: Current Every Day Smoker    Packs/day: 0.50    Years: 25.00    Pack years: 12.50    Types: Cigarettes  . Smokeless tobacco: Never Used  Vaping Use  . Vaping Use: Never used  Substance and Sexual Activity  . Alcohol use: Not Currently    Alcohol/week: 2.0  standard drinks    Types: 2 Standard drinks or equivalent per week  . Drug use: No  . Sexual activity: Not on file  Other Topics Concern  . Not on file  Social History Narrative  . Not on file   Social Determinants of Health   Financial Resource Strain:   . Difficulty of Paying Living Expenses:   Food Insecurity:   . Worried About Programme researcher, broadcasting/film/video in the Last Year:   . Barista in the Last Year:   Transportation Needs:   . Freight forwarder (Medical):   Marland Kitchen Lack of Transportation (Non-Medical):   Physical Activity:   . Days of Exercise per Week:   . Minutes of Exercise per Session:   Stress:   . Feeling of Stress :   Social Connections:   . Frequency of Communication with Friends and Family:   . Frequency of Social Gatherings with Friends and Family:   . Attends Religious Services:   . Active Member of Clubs or Organizations:   . Attends Banker Meetings:   Marland Kitchen Marital Status:   Intimate Partner Violence:   . Fear of Current or Ex-Partner:   . Emotionally Abused:   Marland Kitchen Physically Abused:   . Sexually Abused:     No Known Allergies  Family  History  Problem Relation Age of Onset  . Healthy Mother   . Healthy Father     Prior to Admission medications   Medication Sig Start Date End Date Taking? Authorizing Provider  amoxicillin-clavulanate (AUGMENTIN) 875-125 MG tablet Take 1 tablet by mouth every 12 (twelve) hours. 04/23/20  Yes Eustace Moore, MD  HYDROcodone-acetaminophen (NORCO) 7.5-325 MG tablet Take 1 tablet by mouth every 6 (six) hours as needed for moderate pain. 04/23/20  Yes Eustace Moore, MD  metroNIDAZOLE (FLAGYL) 500 MG tablet Take 1 tablet (500 mg total) by mouth 2 (two) times daily. 04/23/20  Yes Eustace Moore, MD  valACYclovir (VALTREX) 1000 MG tablet Take 1 tablet (1,000 mg total) by mouth 3 (three) times daily. Patient not taking: Reported on 04/26/2020 01/11/16   Devoria Albe, MD  clonazePAM (KLONOPIN) 0.5 MG tablet Take  0.5 mg by mouth at bedtime as needed.    04/23/20  [provider]  FLUoxetine (PROZAC) 40 MG capsule Take 40 mg by mouth daily.    04/23/20  [provider]  gabapentin (NEURONTIN) 300 MG capsule Take 300 mg by mouth 2 (two) times daily.    04/23/20  [provider]  ranitidine (ZANTAC) 150 MG capsule Take 150 mg by mouth 2 (two) times daily.    04/23/20  [provider]    Physical Exam: Vitals:   04/26/20 0846 04/26/20 0847 04/26/20 1220  BP: 129/81  (!) 108/55  Pulse: 84  64  Resp: 16  16  Temp: 98.8 F (37.1 C)    TempSrc: Oral    SpO2: 100%  100%  Weight:  49.9 kg   Height:  5\' 4"  (1.626 m)      . General:  Appears calm and comfortable and is NAD . Eyes:  PERRL, EOMI, normal lids, iris . ENT:  grossly normal hearing, lips & tongue; unable to open her mouth beyond a small opening.  Marked R mandibular swelling with extension below the jaw line and mild erythema extending along her right neck          . Neck:  no LAD, masses or thyromegaly; as noted above . Cardiovascular:  RRR, no m/r/g. No LE edema.  Respiratory:   CTA bilaterally with no wheezes/rales/rhonchi.  Normal respiratory effort. . Abdomen:  soft, NT, ND, NABS . Skin:  no rash or induration seen on limited exam other than streaking erythema as described above . Musculoskeletal:  grossly normal tone BUE/BLE, good ROM, no bony abnormality . Psychiatric:  blunted mood and affect, speech fluent and appropriate, AOx3 . Neurologic:  CN 2-12 grossly intact, moves all extremities in coordinated fashion    Radiological Exams on Admission: CT Maxillofacial Wo Contrast  Result Date: 04/26/2020 CLINICAL DATA:  Right jaw swelling and pain EXAM: CT MAXILLOFACIAL WITHOUT CONTRAST TECHNIQUE: Multidetector CT imaging of the maxillofacial structures was performed. Multiplanar CT image reconstructions were also generated. COMPARISON:  None. FINDINGS: Osseous: There is poor dentition with  significant dental decay. Most notably, there is a grossly carious middle right mandibular molar with significant periapical lucency and dehiscence of the underlying cortex including the lingual surface. Temporomandibular joints are unremarkable. Partially calcified right foraminal protrusion extending into the C5-C6 neural foramen. Orbits: Unremarkable. Sinuses: Minor mucosal thickening. Soft tissues: Contiguous with aforementioned dental disease, there is extensive soft tissue swelling about the body of the mandible on the right extending into the submandibular space. Limited intracranial: No acute abnormality. IMPRESSION: Significant dental decay including carious middle right  mandibular molar with periapical lucency and dehiscence of cortex. Extensive adjacent soft tissue swelling about the body of the mandible extending into the submandibular space. A low-attenuation fluid collection is not identified, but an abscess is difficult to completely exclude on this noncontrast study. Electronically Signed   By: Guadlupe Spanish M.D.   On: 04/26/2020 10:43    EKG: not done   Labs on Admission: I have personally reviewed the available labs and imaging studies at the time of the admission.  Pertinent labs:   K+ 3.1 WBC 15.3 Upreg negative COVID pending   Assessment/Plan Principal Problem:   Facial cellulitis Active Problems:   Tobacco dependence   Poor dentition   Facial cellulitis, poor dentition, probable dental abscess -Patient with poor dentition presenting with apparent dental abscess and surrounding facial cellulitis -Imaging does not indicate clear abscess but she has a giant mass along her R mandibular region with significant underlying dental caries -She was treated at UC with Augmentin and Flagyl without improvement -For now, will admit to Med Surg -IV Unasyn and Vanc -I have discussed the patient with Dr. Barbette Merino and he will see her later today -NPO for now in case intervention is  needed - this appears to be likely -Orthopantogram ordered  Tobacco dependence -Tobacco Dependence: encourage cessation.   -This was discussed with the patient and should be reviewed on an ongoing basis.   -Patch ordered at patient request.    Note: This patient has been tested and is pending for the novel coronavirus COVID-19.  DVT prophylaxis:  SCDs Code Status:  Full - confirmed with patient Family Communication: None present Disposition Plan:  The patient is from: home  Anticipated d/c is to: home without [facility] services once her cardiology issues have been resolved.  Anticipated d/c date will depend on clinical response to treatment, but possibly as early as tomorrow if she has excellent response to treatment  Patient is currently: acutely ill Consults called: Oral surgery Admission status:  Admit - It is my clinical opinion that admission to INPATIENT is reasonable and necessary because of the expectation that this patient will require hospital care that crosses at least 2 midnights to treat this condition based on the medical complexity of the problems presented.  Given the aforementioned information, the predictability of an adverse outcome is felt to be significant.    Jonah Blue MD [facility]   How to contact the [facility] Attending or Consulting provider 7A - 7P or covering provider during after hours 7P -7A, for this patient?  1. Check the care team in [facility] and look for a) attending/consulting [facility] provider listed and b) the [facility] team listed 2. Log into [email or web address] and use [place]'s universal password to access. If you do not have the password, please contact the hospital operator. 3. Locate the [facility] provider you are looking for under [facility] and page to a number that you can be directly reached. 4. If you still have difficulty reaching the provider, please page the [facility] (Director on Call) for the Hospitalists listed on amion for assistance.   04/26/2020,  1:40 PM

## 2020-04-26 NOTE — Anesthesia Procedure Notes (Addendum)
Procedure Name: Awake intubation Performed by: Kipp Brood, MD Pre-anesthesia Checklist: Patient identified, Emergency Drugs available, Suction available, Patient being monitored and Timeout performed Patient Re-evaluated:Patient Re-evaluated prior to induction Oxygen Delivery Method: Circle system utilized and Ambu bag Preoxygenation: Pre-oxygenation with 100% oxygen Tube size: 6.5 mm Number of attempts: 1 Difficulty Due To: Difficulty was anticipated Comments: She was then anesthetized with bilateral topical  superior laryngeal nerve blocks using 4% lidocaine soaked cotton balls.  The right nares was dilated with nasal trumpets and an endotracheal tube was inserted into the right nares.  With fiberoptic guidance the endotracheal tube was inserted through the vocal cords into the trachea and the position was checked with fiberoptic bronchoscopy.  The tube was then secured at the right nares.  There were bilateral breath sounds present and a good end-tidal CO2 waveform.  Her oxygen saturation maintained between 98 and 100% during the entire intubation procedure.

## 2020-04-26 NOTE — Discharge Instructions (Signed)
Please go to the ER for further evaluation °

## 2020-04-26 NOTE — Brief Op Note (Signed)
04/26/2020  6:11 PM  PATIENT:  Amber King  39 y.o. female  PRE-OPERATIVE DIAGNOSIS:  Right submanbibular space abscess, Non-restorable right mandibular molar secondary to dental caries  POST-OPERATIVE DIAGNOSIS:  SAME+ Non-restorable tooth #31.  PROCEDURE:  Procedure(s): EXTRACTIONS TOOTH #31 INCISION AND DRAINAGE RIGHT Submandibular space INFECTION    SURGEON:  Surgeon(s): Ocie Doyne, DDS  ANESTHESIA:   local and general  EBL:  minimal  DRAINS: 1/4 "  PENROSE RIGHT  Submandibular space  SPECIMEN:  No Specimen  COUNTS:  YES  PLAN OF CARE: Discharge to home after PACU  PATIENT DISPOSITION:  PACU - hemodynamically stable.   PROCEDURE DETAILS: Dictation # [id number]  Amber King, DMD 04/26/2020 6:11 PM

## 2020-04-26 NOTE — Progress Notes (Signed)
Pharmacy Antibiotic Note  Amber King is a 39 y.o. female admitted on 04/26/2020 with dental abscess.  Pharmacy has been consulted for vancomycin and unasyn dosing. Pt is afebrile and WBC is elevated at 15.3. Scr is WNL. No improvement with outpatient antibiotics. Already received first dose of vancomycin.   Plan: Unasyn 3gm IV Q8H Vancomycin 500mg  IV Q12H F/u renal fxn, C&S, clinical status and trough at SS  Height: 5\' 4"  (162.6 cm) Weight: 49.9 kg (110 lb) IBW/kg (Calculated) : 54.7  Temp (24hrs), Avg:98.3 F (36.8 C), Min:97.8 F (36.6 C), Max:98.8 F (37.1 C)  Recent Labs  Lab 04/26/20 0959  WBC 15.3*  CREATININE 0.64    Estimated Creatinine Clearance: 74.4 mL/min (by C-G formula based on SCr of 0.64 mg/dL).    No Known Allergies  Antimicrobials this admission: Vanc 8/9>> Unasyn 8/9>>  Dose adjustments this admission: N/A  Microbiology results: Pending  Thank you for allowing pharmacy to be a part of this patient's care.  Lluvia Gwynne, 04/26/2020 1:02 PM

## 2020-04-27 ENCOUNTER — Encounter (HOSPITAL_COMMUNITY): Payer: Self-pay | Admitting: Oral Surgery

## 2020-04-27 DIAGNOSIS — K047 Periapical abscess without sinus: Secondary | ICD-10-CM

## 2020-04-27 LAB — GLUCOSE, CAPILLARY
Glucose-Capillary: 123 mg/dL — ABNORMAL HIGH (ref 70–99)
Glucose-Capillary: 129 mg/dL — ABNORMAL HIGH (ref 70–99)
Glucose-Capillary: 112 mg/dL — ABNORMAL HIGH (ref 70–99)
Glucose-Capillary: 104 mg/dL — ABNORMAL HIGH (ref 70–99)
Glucose-Capillary: 119 mg/dL — ABNORMAL HIGH (ref 70–99)
Glucose-Capillary: 110 mg/dL — ABNORMAL HIGH (ref 70–99)

## 2020-04-27 LAB — BASIC METABOLIC PANEL
Anion gap: 13 (ref 5–15)
BUN: 6 mg/dL (ref 6–20)
CO2: 20 mmol/L — ABNORMAL LOW (ref 22–32)
Calcium: 8.3 mg/dL — ABNORMAL LOW (ref 8.9–10.3)
Chloride: 104 mmol/L (ref 98–111)
Creatinine, Ser: 0.58 mg/dL (ref 0.44–1.00)
GFR calc Af Amer: >60 mL/min (ref >60)
GFR calc non Af Amer: >60 mL/min (ref >60)
Glucose, Bld: 119 mg/dL — ABNORMAL HIGH (ref 70–99)
Potassium: 3.5 mmol/L (ref 3.5–5.1)
Sodium: 137 mmol/L (ref 135–145)

## 2020-04-27 LAB — CBC
HCT: 35.2 % — ABNORMAL LOW (ref 36.0–46.0)
Hemoglobin: 11.7 g/dL — ABNORMAL LOW (ref 12.0–15.0)
MCH: 29.8 pg (ref 26.0–34.0)
MCHC: 33.2 g/dL (ref 30.0–36.0)
MCV: 89.6 fL (ref 80.0–100.0)
Platelets: 253 10*3/uL (ref 150–400)
RBC: 3.93 MIL/uL (ref 3.87–5.11)
RDW: 13 % (ref 11.5–15.5)
WBC: 8 10*3/uL (ref 4.0–10.5)
nRBC: 0 % (ref 0.0–0.2)

## 2020-04-27 LAB — RAPID URINE DRUG SCREEN, HOSP PERFORMED
Amphetamines: NOT DETECTED
Barbiturates: NOT DETECTED
Benzodiazepines: POSITIVE — AB
Cocaine: POSITIVE — AB
Opiates: NOT DETECTED
Tetrahydrocannabinol: POSITIVE — AB

## 2020-04-27 LAB — HIV ANTIBODY (ROUTINE TESTING W REFLEX): HIV Screen 4th Generation wRfx: NONREACTIVE

## 2020-04-27 LAB — MRSA PCR SCREENING: MRSA by PCR: NEGATIVE

## 2020-04-27 MED ORDER — DEXMEDETOMIDINE HCL IN NACL 400 MCG/100ML IV SOLN
0.4000 ug/kg/h | INTRAVENOUS | Status: DC
  Administered 2020-04-27: 0.4 ug/kg/h via INTRAVENOUS
  Filled 2020-04-27: qty 100

## 2020-04-27 MED ORDER — SODIUM CHLORIDE 0.9 % IV SOLN
3.0000 g | Freq: Four times a day (QID) | INTRAVENOUS | Status: DC
  Administered 2020-04-27 – 2020-04-29 (×8): 3 g via INTRAVENOUS
  Filled 2020-04-27 (×12): qty 8

## 2020-04-27 MED ORDER — LACTATED RINGERS IV SOLN: INTRAVENOUS | Status: DC

## 2020-04-27 NOTE — Op Note (Signed)
Amber King, [place] MEDICAL RECORD EY:[id number] ACCOUNT [id number] DATE OF BIRTH:[birth year] FACILITY: [facility] LOCATION: [facility] PHYSICIAN:Elmond Poehlman M. Folashade Gamboa, DDS  OPERATIVE REPORT  DATE OF PROCEDURE:  04/26/2020  PREOPERATIVE DIAGNOSES: 1. Right submandibular space abscess. 2. Nonrestorable right mandibular molar secondary to dental caries.  POSTOPERATIVE DIAGNOSES: 1. Right submandibular space abscess. 2. Nonrestorable right mandibular molar secondary to dental caries. 3. Nonrestorable tooth #31.  PROCEDURE: 1. Extraction of tooth #31. 2. Incision and drainage, right submandibular space infection.  SURGEON:  Ocie Doyne, DDS  ANESTHESIA:  General, Dr. Noreene Larsson attending, nasal intubation.  DESCRIPTION PROCEDURE:  The patient was taken to the operating room and placed on the table in supine position.  The patient was sedated and general anesthesia was administered after fiberoptic nasal intubation was performed under sedation, awake.  The  tube was secured, the eyes were protected and the patient was prepped and draped for surgery.  A timeout was performed.  Then, a sterile marking pen was used to demarcate an area of greatest induration and fluctuance of the right submandibular space.   This was approximately 3 cm below the inferior border of the midportion of the body of the mandible on the right side.  Local anesthesia 2% with 1:100,000 epinephrine was infiltrated in the area of incision and then a 15 blade was used to make a 1 cm  incision through the skin and submucosal tissue.  The curved hemostat was then used to bluntly dissect up to the inferior border of the mandible.  Abundant purulent material was expressed, approximately 10 mL.  Aerobic and anaerobic cultures were taken  and specimen was taken for Gram stain.  The hemostats were advanced forward and posteriorly along the inferior border of the mandible, as well as subcutaneously so the loculations could be opened.  Bimanual  pressure was placed on the area to extrude the  purulent material.  Then, the area was irrigated and left as attention was turned to the oral cavity.  A bite block was placed in the mouth.  Sweetheart was used to retract the tongue and then a throat pack was placed.  Then, local anesthesia 2% with  1:100,000 epinephrine was infiltrated in an inferior alveolar block and infiltration around the tooth to be removed.  After examining the area of tooth #31 had gross dental caries.  The tooth required sectioning with a Stryker handpiece and the roots  were removed with a 301 elevator.  Then, the socket was curetted and granulation debris was removed from the inferior aspect of the socket and then the area was irrigated.  No suture was placed.  Then, the oral cavity was irrigated and suctioned.  The  throat pack was removed.  Bite block was removed and a quarter-inch Penrose drain was placed in the neck incision and ligated to the skin with 3-0 nylon.  Then, a sterile dressing was placed and the patient was left in care of anesthesia for extubation  and transport to recovery room with plans for admission by the hospitalist.  ESTIMATED BLOOD LOSS:  Minimal.  SPECIMENS:  None.  DRAINS:  Quarter-inch Penrose, right submandibular space.     VN/NUANCE  D:04/26/2020 T:04/26/2020 JOB:[id number]

## 2020-04-27 NOTE — Progress Notes (Signed)
Amber King PROGRESS NOTE:  39 yo F s/p Dental extraction, incision and drainage right submandibular space abscess. Currently intubated in ICU for airway protection after difficult intubation and deep space infection.  Vitals: Blood pressure 100/65, pulse (!) 55, temperature 97.6 F (36.4 C), temperature source Axillary, resp. rate 16, height 5\' 4"  (1.626 m), weight 52.3 kg, last menstrual period 04/13/2020, SpO2 100 %. Lab results: Results for orders placed or performed during the hospital encounter of 04/26/20 (from the past 24 hour(s))  Basic metabolic panel     Status: Abnormal   Collection Time: 04/26/20  9:59 AM  Result Value Ref Range   Sodium 137 135 - 145 mmol/L   Potassium 3.1 (L) 3.5 - 5.1 mmol/L   Chloride 99 98 - 111 mmol/L   CO2 26 22 - 32 mmol/L   Glucose, Bld 97 70 - 99 mg/dL   BUN 6 6 - 20 mg/dL   Creatinine, Ser 06/26/20 0.44 - 1.00 mg/dL   Calcium 8.9 8.9 - 2.37 mg/dL   GFR calc non Af Amer >60 >60 mL/min   GFR calc Af Amer >60 >60 mL/min   Anion gap 12 5 - 15  CBC with Differential     Status: Abnormal   Collection Time: 04/26/20  9:59 AM  Result Value Ref Range   WBC 15.3 (H) 4.0 - 10.5 K/uL   RBC 4.50 3.87 - 5.11 MIL/uL   Hemoglobin 13.6 12.0 - 15.0 g/dL   HCT 06/26/20 36 - 46 %   MCV 91.1 80.0 - 100.0 fL   MCH 30.2 26.0 - 34.0 pg   MCHC 33.2 30.0 - 36.0 g/dL   RDW 31.5 17.6 - 16.0 %   Platelets 281 150 - 400 K/uL   nRBC 0.0 0.0 - 0.2 %   Neutrophils Relative % 79 %   Neutro Abs 12.1 (H) 1.7 - 7.7 K/uL   Lymphocytes Relative 13 %   Lymphs Abs 2.1 0.7 - 4.0 K/uL   Monocytes Relative 7 %   Monocytes Absolute 1.0 0 - 1 K/uL   Eosinophils Relative 0 %   Eosinophils Absolute 0.0 0 - 0 K/uL   Basophils Relative 0 %   Basophils Absolute 0.0 0 - 0 K/uL   Immature Granulocytes 1 %   Abs Immature Granulocytes 0.07 0.00 - 0.07 K/uL  I-Stat beta hCG blood, ED     Status: None   Collection Time: 04/26/20 10:03 AM  Result Value Ref Range   I-stat hCG,  quantitative <5.0 <5 mIU/mL   Comment 3          SARS Coronavirus 2 by RT PCR (hospital order, performed in [facility] hospital lab) Nasopharyngeal Nasopharyngeal Swab     Status: None   Collection Time: 04/26/20 12:16 PM   Specimen: Nasopharyngeal Swab  Result Value Ref Range   SARS Coronavirus 2 NEGATIVE NEGATIVE  Aerobic/Anaerobic Culture (surgical/deep wound)     Status: None (Preliminary result)   Collection Time: 04/26/20  6:04 PM   Specimen: PATH Other; Tissue  Result Value Ref Range   Specimen Description ABSCESS RIGHT NECK    Special Requests NONE    Gram Stain      ABUNDANT WBC PRESENT,BOTH PMN AND MONONUCLEAR ABUNDANT GRAM POSITIVE COCCI RARE GRAM NEGATIVE RODS FEW GRAM VARIABLE ROD Performed at [facility], [address], [place], [place]    Culture PENDING    Report Status PENDING   Blood gas, arterial     Status:  Abnormal   Collection Time: 04/26/20  8:36 PM  Result Value Ref Range   FIO2 60.00    pH, Arterial 7.489 (H) 7.35 - 7.45   pCO2 arterial 31.3 (L) 32 - 48 mmHg   pO2, Arterial 189 (H) 83 - 108 mmHg   Bicarbonate 23.6 20.0 - 28.0 mmol/L   Acid-Base Excess 0.5 0.0 - 2.0 mmol/L   O2 Saturation 99.4 %   Patient temperature 37.0    Collection site RIGHT RADIAL    Drawn by [phone]    Sample type ARTERIAL DRAW    Allens test (pass/fail) PASS PASS  MRSA PCR Screening     Status: None   Collection Time: 04/26/20 10:24 PM   Specimen: Nasal Mucosa; Nasopharyngeal  Result Value Ref Range   MRSA by PCR NEGATIVE NEGATIVE  HIV Antibody (routine testing w rflx)     Status: None   Collection Time: 04/26/20 10:57 PM  Result Value Ref Range   HIV Screen 4th Generation wRfx Non Reactive Non Reactive  Triglycerides     Status: None   Collection Time: 04/26/20 10:57 PM  Result Value Ref Range   Triglycerides 134 <150 mg/dL  Hemoglobin A2Z     Status: None   Collection Time: 04/26/20 10:58 PM  Result Value Ref Range   Hgb A1c MFr Bld 5.3 4.8 - 5.6  %   Mean Plasma Glucose 105.41 mg/dL  Glucose, capillary     Status: Abnormal   Collection Time: 04/26/20 11:12 PM  Result Value Ref Range   Glucose-Capillary 108 (H) 70 - 99 mg/dL  Urine rapid drug screen (hosp performed)     Status: Abnormal   Collection Time: 04/26/20 11:44 PM  Result Value Ref Range   Opiates NONE DETECTED NONE DETECTED   Cocaine POSITIVE (A) NONE DETECTED   Benzodiazepines POSITIVE (A) NONE DETECTED   Amphetamines NONE DETECTED NONE DETECTED   Tetrahydrocannabinol POSITIVE (A) NONE DETECTED   Barbiturates NONE DETECTED NONE DETECTED  Glucose, capillary     Status: Abnormal   Collection Time: 04/27/20  3:22 AM  Result Value Ref Range   Glucose-Capillary 123 (H) 70 - 99 mg/dL  Basic metabolic panel     Status: Abnormal   Collection Time: 04/27/20  4:42 AM  Result Value Ref Range   Sodium 137 135 - 145 mmol/L   Potassium 3.5 3.5 - 5.1 mmol/L   Chloride 104 98 - 111 mmol/L   CO2 20 (L) 22 - 32 mmol/L   Glucose, Bld 119 (H) 70 - 99 mg/dL   BUN 6 6 - 20 mg/dL   Creatinine, Ser 3.08 0.44 - 1.00 mg/dL   Calcium 8.3 (L) 8.9 - 10.3 mg/dL   GFR calc non Af Amer >60 >60 mL/min   GFR calc Af Amer >60 >60 mL/min   Anion gap 13 5 - 15  CBC     Status: Abnormal   Collection Time: 04/27/20  4:42 AM  Result Value Ref Range   WBC 8.0 4.0 - 10.5 K/uL   RBC 3.93 3.87 - 5.11 MIL/uL   Hemoglobin 11.7 (L) 12.0 - 15.0 g/dL   HCT 65.7 (L) 36 - 46 %   MCV 89.6 80.0 - 100.0 fL   MCH 29.8 26.0 - 34.0 pg   MCHC 33.2 30.0 - 36.0 g/dL   RDW 84.6 96.2 - 95.2 %   Platelets 253 150 - 400 K/uL   nRBC 0.0 0.0 - 0.2 %   Radiology Results: DG Orthopantogram  Result Date:  04/26/2020 CLINICAL DATA:  Right mandible pain and swelling for 1 week. Broken tooth on the right. Poor dentition. EXAM: ORTHOPANTOGRAM/PANORAMIC COMPARISON:  Maxillofacial CT same date. FINDINGS: No evidence of acute fracture or dislocation. The visualized temporomandibular joints are intact. Typical midline artifact  of the Panorex technique. There are multiple dental caries, most notable in the right mandibular 2nd molar (tooth 31), the left mandibular cuspid (tooth 22), the left mandibular 3rd molar (tooth 17), and the right maxillary 2nd molar (tooth 2). There is apical lucency surrounding the right mandibular 2nd molar. The maxillary 3rd molars appear to have been removed. IMPRESSION: Multiple dental caries with periapical lucencies surrounding the right mandibular 2nd molar. No acute osseous findings. Electronically Signed   By: Carey Bullocks M.D.   On: 04/26/2020 14:52   DG CHEST PORT 1 VIEW  Result Date: 04/26/2020 CLINICAL DATA:  39 year old female status post intubation. EXAM: PORTABLE CHEST 1 VIEW COMPARISON:  Earlier radiograph dated 04/26/2020. FINDINGS: Endotracheal tube with tip approximately 4 cm above the carina. There is no focal consolidation, pleural effusion, or pneumothorax. The cardiac silhouette is within limits. No acute osseous pathology. IMPRESSION: No active disease. Electronically Signed   By: Elgie Collard M.D.   On: 04/26/2020 20:45   Portable Chest x-ray  Result Date: 04/26/2020 CLINICAL DATA:  Endotracheal tube placement EXAM: PORTABLE CHEST 1 VIEW COMPARISON:  None. FINDINGS: The endotracheal tube terminates above the carina by approximately 3.5 cm. There is no pneumothorax. No large pleural effusion. Postoperative atelectasis is noted at the lung bases. The heart size is unremarkable. IMPRESSION: 1. Endotracheal tube as above. 2. Postoperative atelectasis at the lung bases. 3. No pneumothorax or large pleural effusion. Electronically Signed   By: Katherine Mantle M.D.   On: 04/26/2020 19:25   CT Maxillofacial Wo Contrast  Result Date: 04/26/2020 CLINICAL DATA:  Right jaw swelling and pain EXAM: CT MAXILLOFACIAL WITHOUT CONTRAST TECHNIQUE: Multidetector CT imaging of the maxillofacial structures was performed. Multiplanar CT image reconstructions were also generated. COMPARISON:   None. FINDINGS: Osseous: There is poor dentition with significant dental decay. Most notably, there is a grossly carious middle right mandibular molar with significant periapical lucency and dehiscence of the underlying cortex including the lingual surface. Temporomandibular joints are unremarkable. Partially calcified right foraminal protrusion extending into the C5-C6 neural foramen. Orbits: Unremarkable. Sinuses: Minor mucosal thickening. Soft tissues: Contiguous with aforementioned dental disease, there is extensive soft tissue swelling about the body of the mandible on the right extending into the submandibular space. Limited intracranial: No acute abnormality. IMPRESSION: Significant dental decay including carious middle right mandibular molar with periapical lucency and dehiscence of cortex. Extensive adjacent soft tissue swelling about the body of the mandible extending into the submandibular space. A low-attenuation fluid collection is not identified, but an abscess is difficult to completely exclude on this noncontrast study. Electronically Signed   By: Guadlupe Spanish M.D.   On: 04/26/2020 10:43   General appearance: Intubated, under sedation Head: Normocephalic, without obvious abnormality, atraumatic Eyes: negative Nose: Nares normal. Septum midline. Mucosa normal. No drainage or sinus tenderness. Throat: Extraction site hmostatic. No purulence, minimal edema. Neck: Drain intact right neck, moderate drainage.  ASSESSMENT: Stable s/p I and D right submandibular space infection, dental extraction. Decreased edema, WBC decreased to 8.0 from 15.3.  PLAN: Continue IV antibiotics. Extubate per critical care.    Ocie Doyne 04/27/2020

## 2020-04-27 NOTE — Anesthesia Postprocedure Evaluation (Signed)
Anesthesia Post Note  Patient: Amber King  Procedure(s) Performed: DENTAL RESTORATION/EXTRACTIONS TOOTH #31 (N/A ) INCISION AND DRAINAGE ABSCESS RIGHT NECK (Right )     Patient location during evaluation: PACU Anesthesia Type: General Level of consciousness: sedated and patient remains intubated per anesthesia plan Pain management: pain level controlled Vital Signs Assessment: post-procedure vital signs reviewed and stable Respiratory status: patient remains intubated per anesthesia plan and patient on ventilator - see flowsheet for VS Cardiovascular status: blood pressure returned to baseline and stable Anesthetic complications: no   No complications documented.  Last Vitals:  Vitals:   04/27/20 1800 04/27/20 1900  BP: (!) 98/56 (!) 98/56  Pulse: (!) 58 (!) 56  Resp: 16 16  Temp:    SpO2: 100% 100%    Last Pain:  Vitals:   04/27/20 1600  TempSrc: Axillary                 Amber King

## 2020-04-27 NOTE — Progress Notes (Signed)
NAME:  Amber King, MRN:  [id number], DOB:  Feb 12, 1981, LOS: 1 ADMISSION DATE:  04/26/2020, CONSULTATION DATE:  04/27/20 REFERRING MD:  Jonah Blue MD, CHIEF COMPLAINT:  Intubation   Brief History   Amber King is a 39 y.o. female with right submandibular space infection 2/2 dental abscess requiring urgent operative intervention after patient unable to open mouth on day of presentation despite preceding antibiotic therapy. Patient underwent awake fiberoptic nasal intubation and underwent I&D of right neck on 8/9.  History of present illness   Amber King is a 39 y.o. female with no significant medical history who presented from home after multiple days of antibiotic therapy (Augmentin, Flagyl) for a dental abscess with inability to open her mouth. She underwent dental extraction and I&D and was nasally intubated via awake fiberoptic approach. ETT was maintained post-intubation given the severity of neck swelling noted pre-operatively. After arriving in PACU, she developed large King leak with suspected pilot balloon malfunction as the CXR showed appropriate positioning of the ETT. She was subsequently orally intubated without complication. Visible posterior pharynx did not appear significantly edematous post-operatively and the procedure was completed successfully by Anesthesia. PCCM consulted for ventilator management post-operatively.  Past Medical History  PCOS PUD  Significant Hospital Events   Nasal > oral ETT exchange (8/9)  Consults:  PCCM   Procedures:  Dental extraction with I&D (8/9)  Significant Diagnostic Tests:  N/A  Micro Data:  Tissue culture (8/9) pending  Antimicrobials:  Unasyn, vancomycin (8/9-present) Ceftriaxone (8/9) Augmentin, Flagyl (8/6-8/9)   Interim history/subjective:  8/10: pt is awake and following commands. Will cont with steroid dosing and reassess King leak. She does lose TV when deflated but not overwhelming leak.   Objective     Blood pressure 95/60, pulse 60, temperature (!) 97.5 F (36.4 C), temperature source Axillary, resp. rate 16, height 5\' 4"  (1.626 m), weight 52.3 kg, last menstrual period 04/13/2020, SpO2 100 %.    Vent Mode: PRVC FiO2 (%):  [50 %-60 %] 50 % Set Rate:  [16 bmp] 16 bmp Vt Set:  [430 mL] 430 mL PEEP:  [5 cmH20] 5 cmH20 Plateau Pressure:  [11 cmH20-13 cmH20] 13 cmH20   Intake/Output Summary (Last 24 hours) at 04/27/2020 0914 Last data filed at 04/27/2020 0600 Gross per 24 hour  Intake 3399.33 ml  Output 800 ml  Net 2599.33 ml   Filed Weights   04/26/20 0847 04/26/20 2220  Weight: 49.9 kg 52.3 kg    Examination: General: thin Caucasian female, intubated and sedated HENT: orally intubated, packing on R mandibular area with swelling Lungs: CTA b/l, no W/C/R Cardiovascular: RRR, no M/R/G Abdomen: soft, NTND, NABS Extremities: no C/C/E, no rash Neuro: RASS +1, awake and agitated, reorientable GU: deferred  Resolved Hospital Problem list   N/A  Assessment & Plan:  1. Right submandibular space infection 2/2 dental abscess 2. Airway compromise requiring intubation 3. Critical airway Titrate vent - Target VT 6-8 cc/kg/IBW - RASS goal -2 overnight; propofol, fentanyl gtt, PRN fentanyl pushes - Continue Unasyn, vancomycin - Follow up OR cultures - Hold off on OGT for now, plan for extubation later today if King leak there otherwise continue steroids and hopefully in am.   Polysubstance abuse:  -cocaine -thc -benzo  Best practice:  Diet: NPO Pain/Anxiety/Delirium protocol (if indicated): per protocol VAP protocol (if indicated): Chlorhexidine DVT prophylaxis: Lovenox GI prophylaxis: PPI Glucose control: SSI Mobility: Ad lib Code Status: Full Family Communication: significant other at bedside.  Disposition:  ICU  Labs   CBC: Recent Labs  Lab 04/26/20 0959 04/27/20 0442  WBC 15.3* 8.0  NEUTROABS 12.1*  --   HGB 13.6 11.7*  HCT 41.0 35.2*  MCV 91.1 89.6  PLT  281 253    Basic Metabolic Panel: Recent Labs  Lab 04/26/20 0959 04/27/20 0442  NA 137 137  K 3.1* 3.5  CL 99 104  CO2 26 20*  GLUCOSE 97 119*  BUN 6 6  CREATININE 0.64 0.58  CALCIUM 8.9 8.3*   GFR: Estimated Creatinine Clearance: 78 mL/min (by C-G formula based on SCr of 0.58 mg/dL). Recent Labs  Lab 04/26/20 0959 04/27/20 0442  WBC 15.3* 8.0    Liver Function Tests: No results for input(s): AST, ALT, ALKPHOS, BILITOT, PROT, ALBUMIN in the last 168 hours. No results for input(s): LIPASE, AMYLASE in the last 168 hours. No results for input(s): AMMONIA in the last 168 hours.  ABG    Component Value Date/Time   PHART 7.489 (H) 04/26/2020 2036   PCO2ART 31.3 (L) 04/26/2020 2036   PO2ART 189 (H) 04/26/2020 2036   HCO3 23.6 04/26/2020 2036   O2SAT 99.4 04/26/2020 2036     Coagulation Profile: No results for input(s): INR, PROTIME in the last 168 hours.  Cardiac Enzymes: No results for input(s): CKTOTAL, CKMB, CKMBINDEX, TROPONINI in the last 168 hours.  HbA1C: Hgb A1c MFr Bld  Date/Time Value Ref Range Status  04/26/2020 10:58 PM 5.3 4.8 - 5.6 % Final    Comment:    (NOTE) Pre diabetes:          5.7%-6.4%  Diabetes:              >6.4%  Glycemic control for   <7.0% adults with diabetes     CBG: Recent Labs  Lab 04/26/20 2312 04/27/20 0322 04/27/20 0741  GLUCAP 108* 123* 129*   Critical care time: The patient is critically ill with multiple organ systems failure and requires high complexity decision making for assessment and support, frequent evaluation and titration of therapies, application of advanced monitoring technologies and extensive interpretation of multiple databases.  Critical care time 35 mins. This represents my time independent of the NPs time taking care of the pt. This is excluding procedures.    Amber Sites DO [facility] 04/27/2020, 9:14 AM

## 2020-04-27 NOTE — Progress Notes (Signed)
Pharmacy Antibiotic Note  Amber King is a 39 y.o. female admitted on 04/26/2020 with dental abscess.  Pharmacy has been consulted for vancomycin and unasyn dosing.   Will adjust Unasyn dose slight today for indication and renal function, Vancomycin dose remains appropriate.   Plan: Adjust Unasyn to 3g IV every 6 hours Continue Vancomycin 500mg  IV Q12H F/u renal fxn, C&S, clinical status and trough at SS  Height: 5\' 4"  (162.6 cm) Weight: 52.3 kg (115 lb 4.8 oz) IBW/kg (Calculated) : 54.7  Temp (24hrs), Avg:98.4 F (36.9 C), Min:97.5 F (36.4 C), Max:99.2 F (37.3 C)  Recent Labs  Lab 04/26/20 0959 04/27/20 0442  WBC 15.3* 8.0  CREATININE 0.64 0.58    Estimated Creatinine Clearance: 78 mL/min (by C-G formula based on SCr of 0.58 mg/dL).    No Known Allergies  Antimicrobials this admission: Vanc 8/9>> Unasyn 8/9>>  Dose adjustments this admission: N/A  Microbiology results: 8/9 COVID/MRSA >> neg 8/9 R-neck abscess >> GS w/ GPC, rare GNR >> pending  Thank you for allowing pharmacy to be a part of this patient's care.  10/9, PharmD, BCPS Clinical Pharmacist Clinical phone for 04/27/2020: Georgina Pillion 04/27/2020 11:54 AM   **Pharmacist phone directory can now be found on [email or web address] ([email or web address]).  Listed under [facility].

## 2020-04-28 DIAGNOSIS — F17200 Nicotine dependence, unspecified, uncomplicated: Secondary | ICD-10-CM

## 2020-04-28 DIAGNOSIS — F1910 Other psychoactive substance abuse, uncomplicated: Secondary | ICD-10-CM

## 2020-04-28 DIAGNOSIS — K089 Disorder of teeth and supporting structures, unspecified: Secondary | ICD-10-CM

## 2020-04-28 LAB — GLUCOSE, CAPILLARY
Glucose-Capillary: 91 mg/dL (ref 70–99)
Glucose-Capillary: 86 mg/dL (ref 70–99)
Glucose-Capillary: 89 mg/dL (ref 70–99)
Glucose-Capillary: 81 mg/dL (ref 70–99)
Glucose-Capillary: 96 mg/dL (ref 70–99)

## 2020-04-28 NOTE — Progress Notes (Signed)
Amber King PROGRESS NOTE:   SUBJECTIVE: Intubated, sedated but follows commands  OBJECTIVE:  Vitals: Blood pressure (!) 85/52, pulse 67, temperature 97.7 F (36.5 C), temperature source Axillary, resp. rate 19, height 5\' 4"  (1.626 m), weight 52.3 kg, last menstrual period 04/13/2020, SpO2 100 %. Lab results: Results for orders placed or performed during the hospital encounter of 04/26/20 (from the past 24 hour(s))  Glucose, capillary     Status: Abnormal   Collection Time: 04/27/20  7:41 AM  Result Value Ref Range   Glucose-Capillary 129 (H) 70 - 99 mg/dL  Glucose, capillary     Status: Abnormal   Collection Time: 04/27/20 11:24 AM  Result Value Ref Range   Glucose-Capillary 112 (H) 70 - 99 mg/dL  Glucose, capillary     Status: Abnormal   Collection Time: 04/27/20  4:14 PM  Result Value Ref Range   Glucose-Capillary 104 (H) 70 - 99 mg/dL  Glucose, capillary     Status: Abnormal   Collection Time: 04/27/20  7:30 PM  Result Value Ref Range   Glucose-Capillary 119 (H) 70 - 99 mg/dL  Glucose, capillary     Status: Abnormal   Collection Time: 04/27/20 11:17 PM  Result Value Ref Range   Glucose-Capillary 110 (H) 70 - 99 mg/dL  Glucose, capillary     Status: None   Collection Time: 04/28/20  3:27 AM  Result Value Ref Range   Glucose-Capillary 91 70 - 99 mg/dL   Radiology Results: DG Orthopantogram  Result Date: 04/26/2020 CLINICAL DATA:  Right mandible pain and swelling for 1 week. Broken tooth on the right. Poor dentition. EXAM: ORTHOPANTOGRAM/PANORAMIC COMPARISON:  Maxillofacial CT same date. FINDINGS: No evidence of acute fracture or dislocation. The visualized temporomandibular joints are intact. Typical midline artifact of the Panorex technique. There are multiple dental caries, most notable in the right mandibular 2nd molar (tooth 31), the left mandibular cuspid (tooth 22), the left mandibular 3rd molar (tooth 17), and the right maxillary 2nd molar (tooth 2). There is apical  lucency surrounding the right mandibular 2nd molar. The maxillary 3rd molars appear to have been removed. IMPRESSION: Multiple dental caries with periapical lucencies surrounding the right mandibular 2nd molar. No acute osseous findings. Electronically Signed   By: 06/26/2020 M.D.   On: 04/26/2020 14:52   DG CHEST PORT 1 VIEW  Result Date: 04/26/2020 CLINICAL DATA:  39 year old female status post intubation. EXAM: PORTABLE CHEST 1 VIEW COMPARISON:  Earlier radiograph dated 04/26/2020. FINDINGS: Endotracheal tube with tip approximately 4 cm above the carina. There is no focal consolidation, pleural effusion, or pneumothorax. The cardiac silhouette is within limits. No acute osseous pathology. IMPRESSION: No active disease. Electronically Signed   By: 06/26/2020 M.D.   On: 04/26/2020 20:45   Portable Chest x-ray  Result Date: 04/26/2020 CLINICAL DATA:  Endotracheal tube placement EXAM: PORTABLE CHEST 1 VIEW COMPARISON:  None. FINDINGS: The endotracheal tube terminates above the carina by approximately 3.5 cm. There is no pneumothorax. No large pleural effusion. Postoperative atelectasis is noted at the lung bases. The heart size is unremarkable. IMPRESSION: 1. Endotracheal tube as above. 2. Postoperative atelectasis at the lung bases. 3. No pneumothorax or large pleural effusion. Electronically Signed   By: 06/26/2020 M.D.   On: 04/26/2020 19:25   CT Maxillofacial Wo Contrast  Result Date: 04/26/2020 CLINICAL DATA:  Right jaw swelling and pain EXAM: CT MAXILLOFACIAL WITHOUT CONTRAST TECHNIQUE: Multidetector CT imaging of the maxillofacial structures was performed. Multiplanar CT image reconstructions  were also generated. COMPARISON:  None. FINDINGS: Osseous: There is poor dentition with significant dental decay. Most notably, there is a grossly carious middle right mandibular molar with significant periapical lucency and dehiscence of the underlying cortex including the lingual surface.  Temporomandibular joints are unremarkable. Partially calcified right foraminal protrusion extending into the C5-C6 neural foramen. Orbits: Unremarkable. Sinuses: Minor mucosal thickening. Soft tissues: Contiguous with aforementioned dental disease, there is extensive soft tissue swelling about the body of the mandible on the right extending into the submandibular space. Limited intracranial: No acute abnormality. IMPRESSION: Significant dental decay including carious middle right mandibular molar with periapical lucency and dehiscence of cortex. Extensive adjacent soft tissue swelling about the body of the mandible extending into the submandibular space. A low-attenuation fluid collection is not identified, but an abscess is difficult to completely exclude on this noncontrast study. Electronically Signed   By: Guadlupe Spanish M.D.   On: 04/26/2020 10:43   General appearance: Sedated, intubateds, follows commands Head: Normocephalic, without obvious abnormality, atraumatic Eyes: negative Nose: Nares normal. Septum midline. Mucosa normal. No drainage or sinus tenderness. Throat: Opens to approximately 1.5 cm. Oral ETT inplace. Hemostatic Neck: minimal drainage right neck. Penrose drain intact.  ASSESSMENT: Stable s/p I and D right submandibular space abscess, dental extraction.  PLAN: Per Critical care.    Ocie Doyne 04/28/2020

## 2020-04-28 NOTE — Plan of Care (Signed)

## 2020-04-28 NOTE — Procedures (Signed)
Extubation Procedure Note  Patient Details:   Name: Amber King DOB: [birth year] MRN: [id number]   Airway Documentation:    Vent end date: 04/28/20 Vent end time: 0808   Evaluation  O2 sats: stable throughout Complications: No apparent complications Patient did tolerate procedure well. Bilateral Breath Sounds: Clear, Diminished   Yes   Patient extubated per MD order. Positive cuff leak. No stridor noted. Vitals are stable on 2L [place]. RN at bedside.  [name] [name] 04/28/2020, 8:09 AM

## 2020-04-28 NOTE — Progress Notes (Signed)
NAME:  Amber King, MRN:  [id number], DOB:  [birth year], LOS: 2 ADMISSION DATE:  04/26/2020, CONSULTATION DATE:  04/28/20 REFERRING MD:  Jonah Blue MD, CHIEF COMPLAINT:  Intubation   Brief History   Amber King is a 39 y.o. female with right submandibular space infection 2/2 dental abscess requiring urgent operative intervention after patient unable to open mouth on day of presentation despite preceding antibiotic therapy. Patient underwent awake fiberoptic nasal intubation and underwent I&D of right neck on 8/9.  History of present illness   Amber King is a 39 y.o. female with no significant medical history who presented from home after multiple days of antibiotic therapy (Augmentin, Flagyl) for a dental abscess with inability to open her mouth. She underwent dental extraction and I&D and was nasally intubated via awake fiberoptic approach. ETT was maintained post-intubation given the severity of neck swelling noted pre-operatively. After arriving in PACU, she developed large cuff leak with suspected pilot balloon malfunction as the CXR showed appropriate positioning of the ETT. She was subsequently orally intubated without complication. Visible posterior pharynx did not appear significantly edematous post-operatively and the procedure was completed successfully by Anesthesia. PCCM consulted for ventilator management post-operatively.  Past Medical History  PCOS PUD  Significant Hospital Events   Nasal > oral ETT exchange (8/9) Extubated 8/11  Consults:  PCCM   Procedures:  Dental extraction with I&D (8/9)  Significant Diagnostic Tests:  8/9 CT maxillofacial: Significant dental decay including carious middle right mandibular molar with periapical lucency and dehiscence of cortex. Extensive adjacent soft tissue swelling about the body of the mandible extending into the submandibular space. A low-attenuation fluid collection is not identified, but an abscess is difficult to  completely exclude on this noncontrast study.  Micro Data:  Tissue culture (8/9) pending  Antimicrobials:  Unasyn, vancomycin (8/9-present) Ceftriaxone (8/9) Augmentin, Flagyl (8/6-8/9)   Interim history/subjective:  Patient remains very agitated, biting ET tube.  Sedation was stopped, she was put on pressure support trial, initially she was getting apneic on ventilator but after repeated stimulation, patient woke up.  She was successfully extubated.  Objective   Blood pressure (!) 98/55, pulse 72, temperature 97.9 F (36.6 C), temperature source Axillary, resp. rate (!) 7, height 5\' 4"  (1.626 m), weight 52.3 kg, last menstrual period 04/13/2020, SpO2 100 %.    Vent Mode: PRVC FiO2 (%):  [40 %] 40 % Set Rate:  [16 bmp] 16 bmp Vt Set:  [430 mL] 430 mL PEEP:  [5 cmH20] 5 cmH20 Plateau Pressure:  [16 cmH20-17 cmH20] 17 cmH20   Intake/Output Summary (Last 24 hours) at 04/28/2020 1324 Last data filed at 04/28/2020 0800 Gross per 24 hour  Intake 2473.22 ml  Output 1050 ml  Net 1423.22 ml   Filed Weights   04/26/20 0847 04/26/20 2220  Weight: 49.9 kg 52.3 kg    Examination: General: Middle-aged Caucasian female, lying on the bed, agitated HENT: Atraumatic, normocephalic, packing on R mandibular area with swelling Lungs: CTA b/l, no W/C/R Cardiovascular: RRR, no M/R/G Abdomen: soft, NTND, NABS Extremities: no C/C/E, no rash Neuro: RASS +1, awake and agitated, reorientable Skin: No rash  Resolved Hospital Problem list   Postoperative respiratory insufficiency  Assessment & Plan:  Right submandibular space infection 2/2 dental abscess s/p I and D right submandibular space abscess, dental extraction.  Patient was placed on spontaneous breathing trial, after sedation was discontinued.  She was agitated initially but was made calm, closely watched for respiratory distress and finally she  was successfully extubated Speech and swallow evaluation Continue Unasyn,  vancomycin Follow up OR cultures   Polysubstance abuse:  Patient with history of cocaine, THC and benzos abuse Watch closely for signs of withdrawal  Best practice:  Diet: As tolerated Pain/Anxiety/Delirium protocol (if indicated): N/A VAP protocol (if indicated): N/A DVT prophylaxis: Lovenox GI prophylaxis: PPI Glucose control: SSI Mobility: Ad lib Code Status: Full Family Communication: significant other at bedside.  Disposition:  ICU  Labs   CBC: Recent Labs  Lab 04/26/20 0959 04/27/20 0442  WBC 15.3* 8.0  NEUTROABS 12.1*  --   HGB 13.6 11.7*  HCT 41.0 35.2*  MCV 91.1 89.6  PLT 281 253    Basic Metabolic Panel: Recent Labs  Lab 04/26/20 0959 04/27/20 0442  NA 137 137  K 3.1* 3.5  CL 99 104  CO2 26 20*  GLUCOSE 97 119*  BUN 6 6  CREATININE 0.64 0.58  CALCIUM 8.9 8.3*   GFR: Estimated Creatinine Clearance: 78 mL/min (by C-G formula based on SCr of 0.58 mg/dL). Recent Labs  Lab 04/26/20 0959 04/27/20 0442  WBC 15.3* 8.0    Liver Function Tests: No results for input(s): AST, ALT, ALKPHOS, BILITOT, PROT, ALBUMIN in the last 168 hours. No results for input(s): LIPASE, AMYLASE in the last 168 hours. No results for input(s): AMMONIA in the last 168 hours.  ABG    Component Value Date/Time   PHART 7.489 (H) 04/26/2020 2036   PCO2ART 31.3 (L) 04/26/2020 2036   PO2ART 189 (H) 04/26/2020 2036   HCO3 23.6 04/26/2020 2036   O2SAT 99.4 04/26/2020 2036     Coagulation Profile: No results for input(s): INR, PROTIME in the last 168 hours.  Cardiac Enzymes: No results for input(s): CKTOTAL, CKMB, CKMBINDEX, TROPONINI in the last 168 hours.  HbA1C: Hgb A1c MFr Bld  Date/Time Value Ref Range Status  04/26/2020 10:58 PM 5.3 4.8 - 5.6 % Final    Comment:    (NOTE) Pre diabetes:          5.7%-6.4%  Diabetes:              >6.4%  Glycemic control for   <7.0% adults with diabetes     CBG: Recent Labs  Lab 04/27/20 1930 04/27/20 2317  04/28/20 0327 04/28/20 0746 04/28/20 1141  GLUCAP 119* 110* 91 86 89   Total critical care time: 31 minutes  Performed by: Cheri Fowler   Critical care time was exclusive of separately billable procedures and treating other patients.   Critical care was necessary to treat or prevent imminent or life-threatening deterioration.   Critical care was time spent personally by me on the following activities: development of treatment plan with patient and/or surrogate as well as nursing, discussions with consultants, evaluation of patient's response to treatment, examination of patient, obtaining history from patient or surrogate, ordering and performing treatments and interventions, ordering and review of laboratory studies, ordering and review of radiographic studies, pulse oximetry and re-evaluation of patient's condition.   Cheri Fowler MD Critical care physician [facility]  Pager: [phone] Mobile: [phone]

## 2020-04-29 LAB — GLUCOSE, CAPILLARY
Glucose-Capillary: 86 mg/dL (ref 70–99)
Glucose-Capillary: 88 mg/dL (ref 70–99)
Glucose-Capillary: 95 mg/dL (ref 70–99)

## 2020-04-29 MED ORDER — ACETAMINOPHEN 325 MG PO TABS: 650.0000 mg | ORAL_TABLET | Freq: Four times a day (QID) | ORAL | Status: DC | PRN

## 2020-04-29 MED ORDER — AMOXICILLIN-POT CLAVULANATE 875-125 MG PO TABS: 1.0000 | ORAL_TABLET | Freq: Two times a day (BID) | ORAL | 0 refills | Status: DC

## 2020-04-29 MED ORDER — AMOXICILLIN-POT CLAVULANATE 875-125 MG PO TABS
1.0000 | ORAL_TABLET | Freq: Two times a day (BID) | ORAL | Status: DC
  Administered 2020-04-29: 1 via ORAL
  Filled 2020-04-29: qty 1

## 2020-04-29 NOTE — Progress Notes (Addendum)
Amber King PROGRESS NOTE:   SUBJECTIVE: Mild discomfort.   OBJECTIVE:  Vitals: Blood pressure (!) 93/49, pulse 66, temperature 98.1 F (36.7 C), temperature source Oral, resp. rate 15, height 5\' 4"  (1.626 m), weight 52.3 kg, last menstrual period 04/13/2020, SpO2 98 %. Lab results: Results for orders placed or performed during the hospital encounter of 04/26/20 (from the past 24 hour(s))  Glucose, capillary     Status: None   Collection Time: 04/28/20  7:46 AM  Result Value Ref Range   Glucose-Capillary 86 70 - 99 mg/dL  Glucose, capillary     Status: None   Collection Time: 04/28/20 11:41 AM  Result Value Ref Range   Glucose-Capillary 89 70 - 99 mg/dL  Glucose, capillary     Status: None   Collection Time: 04/28/20  4:07 PM  Result Value Ref Range   Glucose-Capillary 81 70 - 99 mg/dL   Comment 1 Notify RN   Glucose, capillary     Status: None   Collection Time: 04/28/20  7:30 PM  Result Value Ref Range   Glucose-Capillary 96 70 - 99 mg/dL  Glucose, capillary     Status: None   Collection Time: 04/29/20 12:47 AM  Result Value Ref Range   Glucose-Capillary 95 70 - 99 mg/dL  Glucose, capillary     Status: None   Collection Time: 04/29/20  6:37 AM  Result Value Ref Range   Glucose-Capillary 88 70 - 99 mg/dL   Radiology Results: No results found. General appearance: alert, cooperative and no distress Head: Normocephalic, without obvious abnormality, atraumatic Eyes: negative Nose: Nares normal. Septum midline. Mucosa normal. No drainage or sinus tenderness. Throat: Opens to 2 cm. No purulence, edema, fluctuance. Pharynx clear. Neck: Minimal drainage R neck. Decreased edema.  ASSESSMENT: Stable for D/C from oral surgery standpoint.   PLAN: Drain removed bedside. Follow-up as outpatient as needed.   06/29/20 04/29/2020

## 2020-04-29 NOTE — TOC Initial Note (Signed)
Transition of Care [facility]) - Initial/Assessment Note    Patient Details  Name: Amber King MRN: [id number] Date of Birth: 02-26-1981  [facility] CM/SW Contact:    Curlene Labrum, RN Phone Number: 04/29/2020, 4:01 PM  Clinical Narrative:                 Case Management met with the patient at the bedside prior to discharge.  The patient lives with Amber King, spouse at home.  I spoke with patient regarding illicit drug and alcohol use and patient denied usage of both (outpatient drug and alcohol treatment services education was give to the patient) - other then 1/2 PPD smoking of cigarettes. Patient will be following up with Dr. Stefanie Libel.  Patient was given dentist list of other dental providers in [facility] that accept Medicaid patients for routine cleanings and checkups.   Expected Discharge Plan: Home/Self Care Barriers to Discharge: No Barriers Identified   Patient Goals and CMS Choice Patient states their goals for this hospitalization and ongoing recovery are:: Patient plans to discharge to home today and ex-spouse is picking her up. CMS Medicare.gov Compare Post Acute Care list provided to:: Patient Choice offered to / list presented to : Patient  Expected Discharge Plan and Services Expected Discharge Plan: Home/Self Care   Discharge Planning Services: CM Consult Post Acute Care Choice:  ([facility] dental list) Living arrangements for the past 2 months: Single Family Home Expected Discharge Date: 04/29/20                                    Prior Living Arrangements/Services Living arrangements for the past 2 months: Single Family Home Lives with:: Spouse (lives with Amber King) Patient language and need for interpreter reviewed:: Yes Do you feel safe going back to the place where you live?: Yes      Need for Family Participation in Patient Care: Yes (Comment) Care giver support system in place?: No (comment)   Criminal  Activity/Legal Involvement Pertinent to Current Situation/Hospitalization: No - Comment as needed  Activities of Daily Living Home Assistive Devices/Equipment: None ADL Screening (condition at time of admission) Patient's cognitive ability adequate to safely complete daily activities?: Yes Is the patient deaf or have difficulty hearing?: No Does the patient have difficulty seeing, even when wearing glasses/contacts?: No Does the patient have difficulty concentrating, remembering, or making decisions?: No Patient able to express need for assistance with ADLs?: Yes Does the patient have difficulty dressing or bathing?: No Independently performs ADLs?: Yes (appropriate for developmental age) Does the patient have difficulty walking or climbing stairs?: No Weakness of Legs: None Weakness of Arms/Hands: None  Permission Sought/Granted Permission sought to share information with : Case Manager Permission granted to share information with : Yes, Verbal Permission Granted        Permission granted to share info w Relationship: spouse - Amber King     Emotional Assessment Appearance:: Appears stated age Attitude/Demeanor/Rapport: Gracious Affect (typically observed): Accepting Orientation: : Oriented to Self, Oriented to Place, Oriented to  Time, Oriented to Situation Alcohol / Substance Use: Tobacco Use Psych Involvement: No (comment)  Admission diagnosis:  Dental infection [K04.7] Poor dentition [K08.9] Facial cellulitis [L03.211] Patient Active Problem List   Diagnosis Date Noted  . Facial cellulitis 04/26/2020  . Tobacco dependence 04/26/2020  . Poor dentition 04/26/2020  . Pain in limb 07/28/2011   PCP:  Kelton Pillar, MD Pharmacy:  [id number] - [address], [place] [address] Phone: [phone] Fax: [phone]     Social Determinants of Health (SDOH)  Interventions    Readmission Risk Interventions Readmission Risk Prevention Plan 04/29/2020  Post Dischage Appt Complete  Medication Screening Complete  Transportation Screening Complete  Some recent data might be hidden

## 2020-04-29 NOTE — Plan of Care (Signed)

## 2020-04-29 NOTE — Progress Notes (Signed)
Discharge package printed. Instruction given to patient. Patient verbalizing understanding.

## 2020-04-29 NOTE — Progress Notes (Signed)
Patient is educated on stop smoking cause it interferes with wound healing. Patient verbalizes understanding and she will stop smoking.

## 2020-04-29 NOTE — Discharge Summary (Signed)
Physician Discharge Summary  SHECCID LAHMANN ZOX:[id number] DOB: [birth year] DOA: 04/26/2020  PCP: Maurice Small, MD  Admit date: 04/26/2020 Discharge date: 04/29/2020  Admitted From: home Discharge disposition: home   Recommendations for Outpatient Follow-Up:   1. Smoking cessation 2. Cocaine/alcohol cessation 3. Finish abx 4. Follows cultures to final   Discharge Diagnosis:   Principal Problem:   Facial cellulitis Active Problems:   Tobacco dependence   Poor dentition    Discharge Condition: Improved.  Diet recommendation:Regular.  Wound care: None.  Code status: Full.   History of Present Illness:   Amber King is a 39 y.o. female with medical history significant of PCOS and panic d/o presenting with dental abscess.  She was seen on 8/6 at Urgent Care and given Flagyl and Augmentin.  She returned to UC this AM and so was sent to the ER.  She reports severe R mouth swelling, unable to open her mouth and it spread under her chin and toward her throat.  +headache.  Pain started a few weeks ago intermittently.  The swelling started but it would not go away so she went to [facility] on Friday.  She has been compliant with antibiotics but did not improve.  No fevers.   Hospital Course by Problem:   Right submandibular space infection 2/2 dental abscess s/p I and D right submandibular space abscess, dental extraction. Patient was placed on spontaneous breathing trial, after sedation was discontinued.  She was agitated initially but was made calm, closely watched for respiratory distress and finally she was successfully extubated -ENT signed off Eating well Follow up OR cultures to final-- prelim is streptococci-- do not think she failed augmentin so will finish course with this  Polysubstance abuse:  Patient with history of cocaine, THC and benzos abuse No sign of withdrawal  Tobacco abuse Encouraged cessation    Medical Consultants:    ENT PCCM   Discharge Exam:   Vitals:   04/29/20 0446 04/29/20 0757  BP: (!) 93/49 102/63  Pulse: 66 61  Resp: 15 17  Temp: 98.1 F (36.7 C) 98.7 F (37.1 C)  SpO2: 98% 99%   Vitals:   04/28/20 2100 04/28/20 2300 04/29/20 0446 04/29/20 0757  BP:  (!) 99/59 (!) 93/49 102/63  Pulse: 66 68 66 61  Resp: 17 18 15 17   Temp:  97.9 F (36.6 C) 98.1 F (36.7 C) 98.7 F (37.1 C)  TempSrc:  Oral Oral Oral  SpO2: 100% 100% 98% 99%  Weight:      Height:        General exam: Appears calm and comfortable. Says she is feeling better, wants to go home   The results of significant diagnostics from this hospitalization (including imaging, microbiology, ancillary and laboratory) are listed below for reference.     Procedures and Diagnostic Studies:   DG Orthopantogram  Result Date: 04/26/2020 CLINICAL DATA:  Right mandible pain and swelling for 1 week. Broken tooth on the right. Poor dentition. EXAM: ORTHOPANTOGRAM/PANORAMIC COMPARISON:  Maxillofacial CT same date. FINDINGS: No evidence of acute fracture or dislocation. The visualized temporomandibular joints are intact. Typical midline artifact of the Panorex technique. There are multiple dental caries, most notable in the right mandibular 2nd molar (tooth 31), the left mandibular cuspid (tooth 22), the left mandibular 3rd molar (tooth 17), and the right maxillary 2nd molar (tooth 2). There is apical lucency surrounding the right mandibular 2nd molar. The maxillary 3rd molars appear to have been removed. IMPRESSION:  Multiple dental caries with periapical lucencies surrounding the right mandibular 2nd molar. No acute osseous findings. Electronically Signed   By: Carey Bullocks M.D.   On: 04/26/2020 14:52   DG CHEST PORT 1 VIEW  Result Date: 04/26/2020 CLINICAL DATA:  39 year old female status post intubation. EXAM: PORTABLE CHEST 1 VIEW COMPARISON:  Earlier radiograph dated 04/26/2020. FINDINGS: Endotracheal tube with tip approximately 4  cm above the carina. There is no focal consolidation, pleural effusion, or pneumothorax. The cardiac silhouette is within limits. No acute osseous pathology. IMPRESSION: No active disease. Electronically Signed   By: Elgie Collard M.D.   On: 04/26/2020 20:45   Portable Chest x-ray  Result Date: 04/26/2020 CLINICAL DATA:  Endotracheal tube placement EXAM: PORTABLE CHEST 1 VIEW COMPARISON:  None. FINDINGS: The endotracheal tube terminates above the carina by approximately 3.5 cm. There is no pneumothorax. No large pleural effusion. Postoperative atelectasis is noted at the lung bases. The heart size is unremarkable. IMPRESSION: 1. Endotracheal tube as above. 2. Postoperative atelectasis at the lung bases. 3. No pneumothorax or large pleural effusion. Electronically Signed   By: Katherine Mantle M.D.   On: 04/26/2020 19:25   CT Maxillofacial Wo Contrast  Result Date: 04/26/2020 CLINICAL DATA:  Right jaw swelling and pain EXAM: CT MAXILLOFACIAL WITHOUT CONTRAST TECHNIQUE: Multidetector CT imaging of the maxillofacial structures was performed. Multiplanar CT image reconstructions were also generated. COMPARISON:  None. FINDINGS: Osseous: There is poor dentition with significant dental decay. Most notably, there is a grossly carious middle right mandibular molar with significant periapical lucency and dehiscence of the underlying cortex including the lingual surface. Temporomandibular joints are unremarkable. Partially calcified right foraminal protrusion extending into the C5-C6 neural foramen. Orbits: Unremarkable. Sinuses: Minor mucosal thickening. Soft tissues: Contiguous with aforementioned dental disease, there is extensive soft tissue swelling about the body of the mandible on the right extending into the submandibular space. Limited intracranial: No acute abnormality. IMPRESSION: Significant dental decay including carious middle right mandibular molar with periapical lucency and dehiscence of cortex.  Extensive adjacent soft tissue swelling about the body of the mandible extending into the submandibular space. A low-attenuation fluid collection is not identified, but an abscess is difficult to completely exclude on this noncontrast study. Electronically Signed   By: Guadlupe Spanish M.D.   On: 04/26/2020 10:43     Labs:   Basic Metabolic Panel: Recent Labs  Lab 04/26/20 0959 04/27/20 0442  NA 137 137  K 3.1* 3.5  CL 99 104  CO2 26 20*  GLUCOSE 97 119*  BUN 6 6  CREATININE 0.64 0.58  CALCIUM 8.9 8.3*   GFR Estimated Creatinine Clearance: 78 mL/min (by C-G formula based on SCr of 0.58 mg/dL). Liver Function Tests: No results for input(s): AST, ALT, ALKPHOS, BILITOT, PROT, ALBUMIN in the last 168 hours. No results for input(s): LIPASE, AMYLASE in the last 168 hours. No results for input(s): AMMONIA in the last 168 hours. Coagulation profile No results for input(s): INR, PROTIME in the last 168 hours.  CBC: Recent Labs  Lab 04/26/20 0959 04/27/20 0442  WBC 15.3* 8.0  NEUTROABS 12.1*  --   HGB 13.6 11.7*  HCT 41.0 35.2*  MCV 91.1 89.6  PLT 281 253   Cardiac Enzymes: No results for input(s): CKTOTAL, CKMB, CKMBINDEX, TROPONINI in the last 168 hours. BNP: Invalid input(s): POCBNP CBG: Recent Labs  Lab 04/28/20 1141 04/28/20 1607 04/28/20 1930 04/29/20 0047 04/29/20 0637  GLUCAP 89 81 96 95 88   D-Dimer No results  for input(s): DDIMER in the last 72 hours. Hgb A1c Recent Labs    04/26/20 2258  HGBA1C 5.3   Lipid Profile Recent Labs    04/26/20 2257  TRIG 134   Thyroid function studies No results for input(s): TSH, T4TOTAL, T3FREE, THYROIDAB in the last 72 hours.  Invalid input(s): FREET3 Anemia work up No results for input(s): VITAMINB12, FOLATE, FERRITIN, TIBC, IRON, RETICCTPCT in the last 72 hours. Microbiology Recent Results (from the past 240 hour(s))  SARS Coronavirus 2 by RT PCR (hospital order, performed in [facility] hospital lab)  Nasopharyngeal Nasopharyngeal Swab     Status: None   Collection Time: 04/26/20 12:16 PM   Specimen: Nasopharyngeal Swab  Result Value Ref Range Status   SARS Coronavirus 2 NEGATIVE NEGATIVE Final    Comment: (NOTE) SARS-CoV-2 target nucleic acids are NOT DETECTED.  The SARS-CoV-2 RNA is generally detectable in upper and lower respiratory specimens during the acute phase of infection. The lowest concentration of SARS-CoV-2 viral copies this assay can detect is 250 copies / mL. A negative result does not preclude SARS-CoV-2 infection and should not be used as the sole basis for treatment or other patient management decisions.  A negative result may occur with improper specimen collection / handling, submission of specimen other than nasopharyngeal swab, presence of viral mutation(s) within the areas targeted by this assay, and inadequate number of viral copies (<250 copies / mL). A negative result must be combined with clinical observations, patient history, and epidemiological information.  Fact Sheet for Patients:   BoilerBrush.com.cy  Fact Sheet for Healthcare Providers: https://pope.com/  This test is not yet approved or  cleared by the [place] FDA and has been authorized for detection and/or diagnosis of SARS-CoV-2 by FDA under an Emergency Use Authorization (EUA).  This EUA will remain in effect (meaning this test can be used) for the duration of the COVID-19 declaration under Section 564(b)(1) of the Act, 21 U.S.C. section 360bbb-3(b)(1), unless the authorization is terminated or revoked sooner.  Performed at [facility], [address], [place], [place] [address]   Aerobic/Anaerobic Culture (surgical/deep wound)     Status: None (Preliminary result)   Collection Time: 04/26/20  6:04 PM   Specimen: PATH Other; Tissue  Result Value Ref Range Status   Specimen Description ABSCESS RIGHT NECK  Final   Special  Requests NONE  Final   Gram Stain   Final    ABUNDANT WBC PRESENT,BOTH PMN AND MONONUCLEAR ABUNDANT GRAM POSITIVE COCCI RARE GRAM NEGATIVE RODS FEW GRAM VARIABLE ROD Performed at [facility], [address], [place], [place] [address]    Culture   Final    FEW GRAM POSITIVE COCCI NO ANAEROBES ISOLATED; CULTURE IN PROGRESS FOR 5 DAYS    Report Status PENDING  Incomplete  MRSA PCR Screening     Status: None   Collection Time: 04/26/20 10:24 PM   Specimen: Nasal Mucosa; Nasopharyngeal  Result Value Ref Range Status   MRSA by PCR NEGATIVE NEGATIVE Final    Comment:        The GeneXpert MRSA Assay (FDA approved for NASAL specimens only), is one component of a comprehensive MRSA colonization surveillance program. It is not intended to diagnose MRSA infection nor to guide or monitor treatment for MRSA infections. Performed at [facility], [address], [place], [place] [address]      Discharge Instructions:   Discharge Instructions    Change dressing (specify)   Complete by:  As directed    As needed   Diet general   Complete by: As directed    Discharge instructions   Complete by: As directed    Stop smoking as it can interfere with healing   Increase activity slowly   Complete by: As directed      Allergies as of 04/29/2020   No Known Allergies     Medication List    STOP taking these medications   metroNIDAZOLE 500 MG tablet Commonly known as: FLAGYL     TAKE these medications   acetaminophen 325 MG tablet Commonly known as: TYLENOL Take 2 tablets (650 mg total) by mouth every 6 (six) hours as needed for mild pain (or Fever >/= 101).   amoxicillin-clavulanate 875-125 MG tablet Commonly known as: AUGMENTIN Take 1 tablet by mouth every 12 (twelve) hours.   HYDROcodone-acetaminophen 7.5-325 MG tablet Commonly known as: Norco Take 1 tablet by mouth every 6 (six) hours as needed for moderate pain.            Discharge Care Instructions   (From admission, onward)         Start     Ordered   04/29/20 0000  Change dressing (specify)       Comments: As needed   04/29/20 1054          Follow-up Information    Ocie DoyneJensen, Scott, DDS.   Specialty: Oral Surgery Why: As needed Contact information: [address] [place] [address] [phone]        Maurice SmallGriffin, Elaine, MD Follow up in 1 week(s).   Specialty: Family Medicine Contact information: [address] [place] [address] [phone]                Time coordinating discharge: 35 min  Signed:  Joseph ArtJessica U Sankalp Ferrell DO  [facility] 04/29/2020, 10:55 AM

## 2020-04-30 ENCOUNTER — Telehealth: Payer: Self-pay | Admitting: *Deleted

## 2020-04-30 NOTE — Telephone Encounter (Signed)
Pt called back. Transition of care assessment completed:  Transition Care Management Follow-up Telephone Call  . Medicaid Managed Care Transition Call Status:MM [facility] Call Made  . Date of discharge and from where: [facility], 04/29/20  . How have you been since you were released from the hospital? "feel ok" . Any questions or concerns? No  Items Reviewed: Marland Kitchen Did the pt receive and understand the discharge instructions provided? Yes  . Medications obtained and verified? Yes  . Any new allergies since your discharge? No  . Dietary orders reviewed? Yes . Do you have support at home? Yes, family  Functional Questionnaire: (I = Independent and D = Dependent)  ADLs: Independent Bathing/Dressing:Independent Meal Prep: Independent Eating: Independent Maintaining continence: Independent Transferring/Ambulation: Independent Managing Meds: Independent  Follow up appointments reviewed:  PCP Hospital f/u appt confirmed? No   Pt to call Dr Maurice Small on 04/30/20 to schedule follow up appt  Specialist Hospital f/u appt confirmed? Pt to call Dr Ocie Doyne on 04/30/20 to schedule follow up appt Are transportation arrangements needed? Yes   If their condition worsens, is the pt aware to call PCP or go to the EmergencyDept. Yes  Was the patient provided with contact information for the PCP's office or ED? Yes  Was to pt encouraged to call back with questions or concerns? Yes

## 2020-04-30 NOTE — Telephone Encounter (Signed)
Medicaid Managed Care team Transition of Care Assessment outreach attempt #1 made today. Unable to reach patient. HIPPA compliant voice message left requesting a return call. The patient has also been enrolled in an automated discharge follow up call series and will receive two outreach attempts for transition of care assessment. Contact information has been left for the patient and the Medicaid Managed Care team is available to provide assistance to the patient at any time.  ° °Katrice Suede [place], RN, BSN, CCRN °Patient Engagement Center °[phone] ° °

## 2020-05-01 LAB — AEROBIC/ANAEROBIC CULTURE W GRAM STAIN (SURGICAL/DEEP WOUND)

## 2020-12-27 ENCOUNTER — Encounter (HOSPITAL_COMMUNITY): Payer: Self-pay

## 2020-12-27 ENCOUNTER — Ambulatory Visit (HOSPITAL_COMMUNITY)
Admission: EM | Admit: 2020-12-27 | Discharge: 2020-12-27 | Disposition: A | Discharge status: Home / Self Care | Payer: Medicaid Other | Attending: Student | Admitting: Student

## 2020-12-27 ENCOUNTER — Other Ambulatory Visit: Payer: Self-pay

## 2020-12-27 DIAGNOSIS — K011 Impacted teeth: Secondary | ICD-10-CM | POA: Diagnosis not present

## 2020-12-27 DIAGNOSIS — K047 Periapical abscess without sinus: Secondary | ICD-10-CM | POA: Diagnosis not present

## 2020-12-27 MED ORDER — AMOXICILLIN-POT CLAVULANATE 875-125 MG PO TABS: 1.0000 | ORAL_TABLET | Freq: Two times a day (BID) | ORAL | 0 refills | Status: DC

## 2020-12-27 MED ORDER — MELOXICAM 7.5 MG PO TABS: 7.5000 mg | ORAL_TABLET | Freq: Every day | ORAL | 0 refills | Status: AC

## 2020-12-27 NOTE — Discharge Instructions (Addendum)
-  Start the antibiotic, Augmentin (amoxicillin-clavulanate) twice daily for 7 days.  You can take this with food if you have a sensitive stomach. -Meloxicam (Mobic) once daily for pain.  Since you took ibuprofen this morning, wait until this evening to start the Mobic.  Make sure you take this with food.  You can still take Tylenol 1000 mg 3 times daily, but avoid other NSAIDs while taking Meloxicam. -Follow-up with surgeon for removal.  Dental guard provided in this paperwork  -Seek additional immediate medical attention if your symptoms worsen or persist despite treatment, like new fever/chills, worsening of swelling and pain, etc.

## 2020-12-27 NOTE — ED Provider Notes (Signed)
[facility]    CSN: [id number] Arrival date & time: 12/27/20  0940      History   Chief Complaint Chief Complaint  Patient presents with  . Dental Pain    HPI Amber King is a 40 y.o. female presenting with dental pain.  Medical history poor dentition, facial cellulitis, panic attacks, peptic ulcer disease, tobacco dependence.  Presenting with 3 days of pain and inflammation of her left lower impacted wisdom tooth.  States she has had some issues with the right upper wisdom tooth as well, but today it is the left one that is bothering her.  Denies foul taste in mouth, fever/chills, trouble swallowing.  Taking Tylenol and ibuprofen with some control of the pain.  HPI  Past Medical History:  Diagnosis Date  . Panic attacks   . PCOS (polycystic ovarian syndrome)   . Peptic ulcer disease     Patient Active Problem List   Diagnosis Date Noted  . Facial cellulitis 04/26/2020  . Tobacco dependence 04/26/2020  . Poor dentition 04/26/2020  . Pain in limb 07/28/2011    Past Surgical History:  Procedure Laterality Date  . INCISION AND DRAINAGE ABSCESS Right 04/26/2020   Procedure: INCISION AND DRAINAGE ABSCESS RIGHT NECK;  Surgeon: Ocie Doyne, DDS;  Location: [facility] OR;  Service: Oral Surgery;  Laterality: Right;  . MOUTH SURGERY    . TOOTH EXTRACTION N/A 04/26/2020   Procedure: DENTAL RESTORATION/EXTRACTIONS TOOTH #31;  Surgeon: Ocie Doyne, DDS;  Location: [facility] OR;  Service: Oral Surgery;  Laterality: N/A;    OB History   No obstetric history on file.      Home Medications    Prior to Admission medications   Medication Sig Start Date End Date Taking? Authorizing Provider  amoxicillin-clavulanate (AUGMENTIN) 875-125 MG tablet Take 1 tablet by mouth every 12 (twelve) hours. 12/27/20  Yes Rhys Martini, PA-C  meloxicam (MOBIC) 7.5 MG tablet Take 1 tablet (7.5 mg total) by mouth daily. 12/27/20  Yes Rhys Martini, PA-C  clonazePAM (KLONOPIN) 0.5 MG tablet Take  0.5 mg by mouth at bedtime as needed.    04/23/20  [provider]  FLUoxetine (PROZAC) 40 MG capsule Take 40 mg by mouth daily.    04/23/20  [provider]  gabapentin (NEURONTIN) 300 MG capsule Take 300 mg by mouth 2 (two) times daily.    04/23/20  [provider]  ranitidine (ZANTAC) 150 MG capsule Take 150 mg by mouth 2 (two) times daily.    04/23/20  [provider]    Family History Family History  Problem Relation Age of Onset  . Healthy Mother   . Healthy Father     Social History Social History   Tobacco Use  . Smoking status: Current Every Day Smoker    Packs/day: 0.50    Years: 25.00    Pack years: 12.50    Types: Cigarettes  . Smokeless tobacco: Never Used  Vaping Use  . Vaping Use: Never used  Substance Use Topics  . Alcohol use: Not Currently    Alcohol/week: 2.0 standard drinks    Types: 2 Standard drinks or equivalent per week  . Drug use: No     Allergies   Patient has no known allergies.   Review of Systems Review of Systems  Constitutional: Negative for chills and fever.  HENT: Positive for dental problem. Negative for trouble swallowing.   All other systems reviewed and are negative.    Physical Exam Triage Vital  Signs ED Triage Vitals  Enc Vitals Group     BP      Pulse      Resp      Temp      Temp src      SpO2      Weight      Height      Head Circumference      Peak Flow      Pain Score      Pain Loc      Pain Edu?      Excl. in GC?    No data found.  Updated Vital Signs BP 108/73   Pulse 73   Temp 98.1 F (36.7 C)   Resp 17   LMP 12/20/2020 (Approximate)   SpO2 98%   Visual Acuity Right Eye Distance:   Left Eye Distance:   Bilateral Distance:    Right Eye Near:   Left Eye Near:    Bilateral Near:     Physical Exam Vitals reviewed.  Constitutional:      General: She is not in acute distress.    Appearance: Normal appearance. She is not ill-appearing, toxic-appearing or  diaphoretic.  HENT:     Head: Normocephalic and atraumatic.     Jaw: There is normal jaw occlusion. No trismus, tenderness, swelling, pain on movement or malocclusion.     Salivary Glands: Right salivary gland is not diffusely enlarged or tender. Left salivary gland is not diffusely enlarged or tender.     Right Ear: Hearing normal.     Left Ear: Hearing normal.     Nose: Nose normal.     Mouth/Throat:     Lips: Pink.     Mouth: Mucous membranes are moist. No lacerations or oral lesions.     Dentition: Abnormal dentition. Does not have dentures. Dental tenderness and dental caries present.     Tongue: No lesions. Tongue does not deviate from midline.     Palate: No mass.     Pharynx: Oropharynx is clear. Uvula midline. No oropharyngeal exudate or posterior oropharyngeal erythema.     Tonsils: No tonsillar exudate or tonsillar abscesses.      Comments: Poor dentician  Left lower wisdom tooth is impacted and is barely visible. Surrounded by erythema swelling, tenderness. No discharge. No tenderness erythema swelling of roof or floor of mouth, no trismus, normal phonation. R upper wisdom tooth is also impacted but without infection. Eyes:     Extraocular Movements: Extraocular movements intact.     Pupils: Pupils are equal, round, and reactive to light.  Pulmonary:     Effort: Pulmonary effort is normal.  Neurological:     General: No focal deficit present.     Mental Status: She is alert and oriented to person, place, and time.  Psychiatric:        Mood and Affect: Mood normal.        Behavior: Behavior normal.        Thought Content: Thought content normal.        Judgment: Judgment normal.      UC Treatments / Results  Labs (all labs ordered are listed, but only abnormal results are displayed) Labs Reviewed - No data to display  EKG   Radiology No results found.  Procedures Procedures (including critical care time)  Medications Ordered in UC Medications - No data to  display  Initial Impression / Assessment and Plan / UC Course  I have reviewed the triage vital signs  and the nursing notes.  Pertinent labs & imaging results that were available during my care of the patient were reviewed by me and considered in my medical decision making (see chart for details).     This patient is a 40 year old female presenting with dental infection. Today this pt is afebrile nontachycardic nontachypneic, oxygenating well on room air, no wheezes rhonchi or rales.   Augmentin and meloxicam sent. She is not pregnant or breastfeeding and has normal kidney function. Dental resource guide provided; f/u with Energy manager. ED return precautions discussed.  Final Clinical Impressions(s) / UC Diagnoses   Final diagnoses:  Impacted tooth  Dental infection     Discharge Instructions     -Start the antibiotic, Augmentin (amoxicillin-clavulanate) twice daily for 7 days.  You can take this with food if you have a sensitive stomach. -Meloxicam (Mobic) once daily for pain.  Since you took ibuprofen this morning, wait until this evening to start the Mobic.  Make sure you take this with food.  You can still take Tylenol 1000 mg 3 times daily, but avoid other NSAIDs while taking Meloxicam. -Follow-up with surgeon for removal.  Dental guard provided in this paperwork  -Seek additional immediate medical attention if your symptoms worsen or persist despite treatment, like new fever/chills, worsening of swelling and pain, etc.    ED Prescriptions    Medication Sig Dispense Auth. Provider   meloxicam (MOBIC) 7.5 MG tablet Take 1 tablet (7.5 mg total) by mouth daily. 14 tablet Rhys Martini, PA-C   amoxicillin-clavulanate (AUGMENTIN) 875-125 MG tablet Take 1 tablet by mouth every 12 (twelve) hours. 14 tablet Rhys Martini, PA-C     PDMP not reviewed this encounter.   Rhys Martini, PA-C 12/27/20 1144

## 2020-12-27 NOTE — ED Triage Notes (Signed)
Pt in with c/o right lower wisdom pain that started on Friday  States that the pain is making her jaw and ear hurt  Pt has been taking tylenol with no relief

## 2021-06-07 IMAGING — DX DG ORTHOPANTOGRAM /PANORAMIC
1 series · 1 of 1 positions shown · non-contrast
Comparison: Maxillofacial CT same date.

CLINICAL DATA: Right mandible pain and swelling for 1 week. Broken
tooth on the right. Poor dentition.

EXAM:
ORTHOPANTOGRAM/PANORAMIC

[view not recorded]
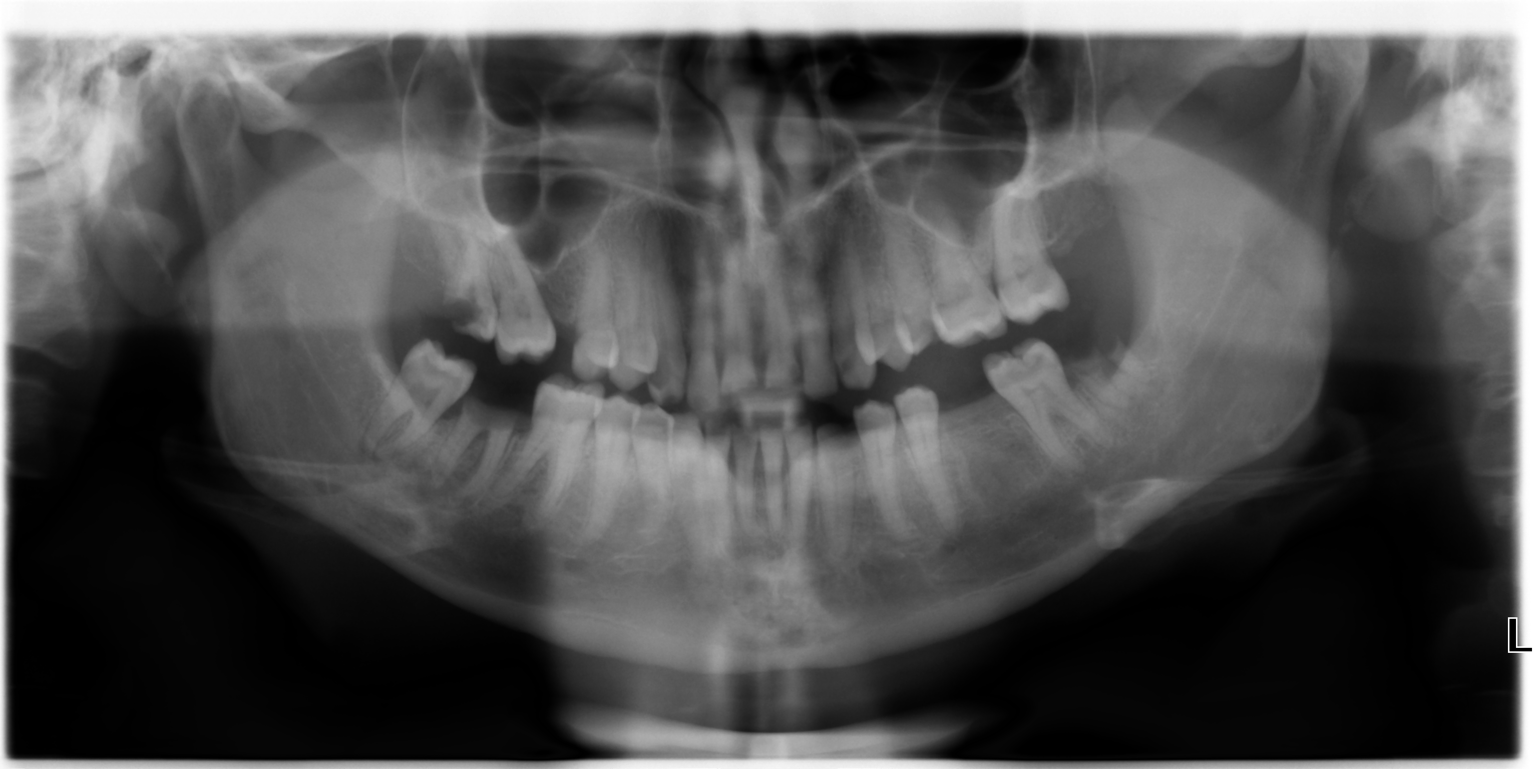

[1 of 1 positions shown; findings below may reference images not displayed]

FINDINGS: No evidence of acute fracture or dislocation. The visualized
temporomandibular joints are intact. Typical midline artifact of the
Panorex technique.

There are multiple dental caries, most notable in the right
mandibular 2nd molar (tooth 31), the left mandibular cuspid (tooth
22), the left mandibular 3rd molar (tooth 17), and the right
maxillary 2nd molar (tooth 2). There is apical lucency surrounding
the right mandibular 2nd molar. The maxillary 3rd molars appear to
have been removed.
IMPRESSION: Multiple dental caries with periapical lucencies surrounding the
right mandibular 2nd molar. No acute osseous findings.

## 2021-06-07 IMAGING — DX DG CHEST 1V PORT
1 series · 1 of 1 positions shown · non-contrast
Comparison: None.

CLINICAL DATA: Endotracheal tube placement

EXAM:
PORTABLE CHEST 1 VIEW

[chest ap]
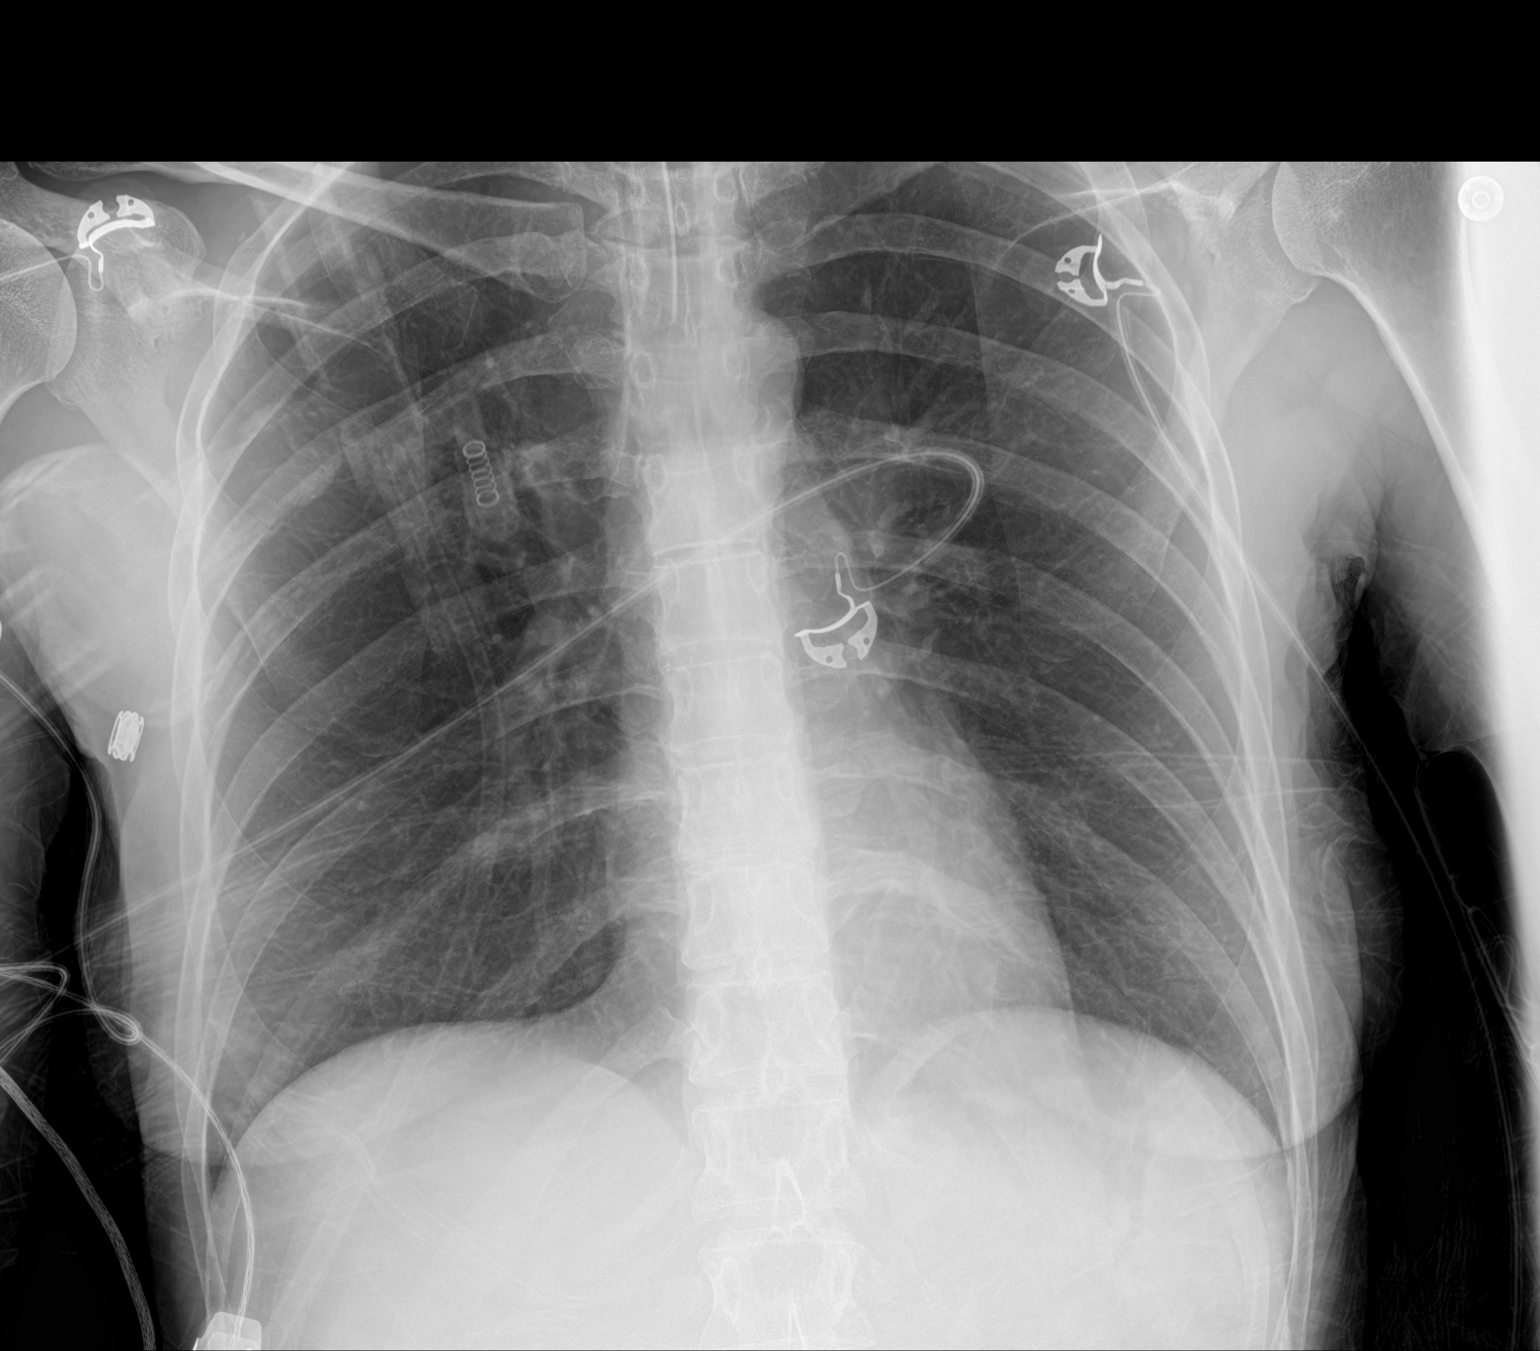

[1 of 1 positions shown; findings below may reference images not displayed]

FINDINGS: The endotracheal tube terminates above the carina by approximately
3.5 cm. There is no pneumothorax. No large pleural effusion.
Postoperative atelectasis is noted at the lung bases. The heart size
is unremarkable.
IMPRESSION: 1. Endotracheal tube as above.
2. Postoperative atelectasis at the lung bases.
3. No pneumothorax or large pleural effusion.

## 2021-07-26 ENCOUNTER — Other Ambulatory Visit: Payer: Self-pay

## 2021-07-26 ENCOUNTER — Emergency Department (HOSPITAL_COMMUNITY)
Admission: EM | Admit: 2021-07-26 | Discharge: 2021-07-26 | Disposition: A | Discharge status: Home / Self Care | Payer: Medicaid Other | Attending: Emergency Medicine | Admitting: Emergency Medicine

## 2021-07-26 ENCOUNTER — Ambulatory Visit
Admission: EM | Admit: 2021-07-26 | Discharge: 2021-07-26 | Disposition: A | Discharge status: Home / Self Care | Payer: Medicaid Other

## 2021-07-26 ENCOUNTER — Emergency Department (HOSPITAL_COMMUNITY): Payer: Medicaid Other

## 2021-07-26 DIAGNOSIS — L0201 Cutaneous abscess of face: Secondary | ICD-10-CM | POA: Insufficient documentation

## 2021-07-26 DIAGNOSIS — K047 Periapical abscess without sinus: Secondary | ICD-10-CM | POA: Insufficient documentation

## 2021-07-26 DIAGNOSIS — F17210 Nicotine dependence, cigarettes, uncomplicated: Secondary | ICD-10-CM | POA: Insufficient documentation

## 2021-07-26 DIAGNOSIS — R6884 Jaw pain: Secondary | ICD-10-CM | POA: Diagnosis present

## 2021-07-26 DIAGNOSIS — K0889 Other specified disorders of teeth and supporting structures: Secondary | ICD-10-CM | POA: Diagnosis not present

## 2021-07-26 LAB — CBC WITH DIFFERENTIAL/PLATELET
Abs Immature Granulocytes: 0.05 10*3/uL (ref 0.00–0.07)
Basophils Absolute: 0 10*3/uL (ref 0.0–0.1)
Basophils Relative: 0 %
Eosinophils Absolute: 0.1 10*3/uL (ref 0.0–0.5)
Eosinophils Relative: 1 %
HCT: 35.5 % — ABNORMAL LOW (ref 36.0–46.0)
Hemoglobin: 12.4 g/dL (ref 12.0–15.0)
Immature Granulocytes: 0 %
Lymphocytes Relative: 21 %
Lymphs Abs: 2.4 10*3/uL (ref 0.7–4.0)
MCH: 31.3 pg (ref 26.0–34.0)
MCHC: 34.9 g/dL (ref 30.0–36.0)
MCV: 89.6 fL (ref 80.0–100.0)
Monocytes Absolute: 0.9 10*3/uL (ref 0.1–1.0)
Monocytes Relative: 8 %
Neutro Abs: 7.7 10*3/uL (ref 1.7–7.7)
Neutrophils Relative %: 70 %
Platelets: 206 10*3/uL (ref 150–400)
RBC: 3.96 MIL/uL (ref 3.87–5.11)
RDW: 13.5 % (ref 11.5–15.5)
WBC: 11.1 10*3/uL — ABNORMAL HIGH (ref 4.0–10.5)
nRBC: 0 % (ref 0.0–0.2)

## 2021-07-26 LAB — PREGNANCY, URINE: Preg Test, Ur: NEGATIVE

## 2021-07-26 LAB — BASIC METABOLIC PANEL
Anion gap: 8 (ref 5–15)
BUN: 5 mg/dL — ABNORMAL LOW (ref 6–20)
CO2: 23 mmol/L (ref 22–32)
Calcium: 8.6 mg/dL — ABNORMAL LOW (ref 8.9–10.3)
Chloride: 107 mmol/L (ref 98–111)
Creatinine, Ser: 0.5 mg/dL (ref 0.44–1.00)
GFR, Estimated: >60 mL/min (ref >60)
Glucose, Bld: 103 mg/dL — ABNORMAL HIGH (ref 70–99)
Potassium: 3.1 mmol/L — ABNORMAL LOW (ref 3.5–5.1)
Sodium: 138 mmol/L (ref 135–145)

## 2021-07-26 MED ORDER — KETOROLAC TROMETHAMINE 30 MG/ML IJ SOLN
30.0000 mg | Freq: Once | INTRAMUSCULAR | Status: AC
  Administered 2021-07-26: 30 mg via INTRAVENOUS
  Filled 2021-07-26: qty 1

## 2021-07-26 MED ORDER — CLINDAMYCIN PHOSPHATE 600 MG/50ML IV SOLN
600.0000 mg | Freq: Once | INTRAVENOUS | Status: AC
  Administered 2021-07-26: 600 mg via INTRAVENOUS
  Filled 2021-07-26: qty 50

## 2021-07-26 MED ORDER — BUPIVACAINE HCL (PF) 0.5 % IJ SOLN
20.0000 mL | Freq: Once | INTRAMUSCULAR | Status: AC
  Administered 2021-07-26: 20 mL
  Filled 2021-07-26: qty 30

## 2021-07-26 MED ORDER — CHLORHEXIDINE GLUCONATE 0.12 % MT SOLN: 15.0000 mL | Freq: Two times a day (BID) | OROMUCOSAL | 0 refills | Status: AC

## 2021-07-26 MED ORDER — IOHEXOL 300 MG/ML  SOLN
75.0000 mL | Freq: Once | INTRAMUSCULAR | Status: AC | PRN
  Administered 2021-07-26: 75 mL via INTRAVENOUS

## 2021-07-26 MED ORDER — CLINDAMYCIN HCL 300 MG PO CAPS: 300.0000 mg | ORAL_CAPSULE | Freq: Four times a day (QID) | ORAL | 0 refills | Status: DC

## 2021-07-26 NOTE — Discharge Instructions (Addendum)
Take clindamycin 4 times daily with food on your stomach for the next 7 days.  For pain you can use Motrin 600 mg and Tylenol 1000 mg every 6 hours.  Please call to schedule follow-up appointment with Dr. Barbette Merino with oral surgery who did your previous surgery.  You can use Peridex mouthwash 3 times daily after eating to help ensure no food is getting caught in the pocket from the abscess drainage today.  You can also apply warm compresses to the area on the side of your chin that we drained some pus from today.  If you develop worsening swelling, pain or swelling under your tongue, fevers, difficulty breathing or swallowing please immediately return to the emergency department.

## 2021-07-26 NOTE — ED Triage Notes (Signed)
Abscess left lower jaw, referral from urgent care

## 2021-07-26 NOTE — Discharge Instructions (Addendum)
-  I am very concerned about your dental infection.  This is progressing into a severe infection. If not properly treated, this could result in Ludwig's angina: a life-threatening diffuse cellulitis of the soft tissue of the floor of the mouth and neck.

## 2021-07-26 NOTE — ED Triage Notes (Signed)
Pt reports dental pain x 2 weeks. States the pain is worse in the past 2-3 days.

## 2021-07-26 NOTE — ED Notes (Signed)
Patient is being discharged from the Urgent Care and sent to the Emergency Department via private vehicle . Per PA, patient is in need of higher level of care due to dental infection. Patient is aware and verbalizes understanding of plan of care.  Vitals:   07/26/21 1649  BP: 131/75  Pulse: 61  Resp: 16  Temp: 98 F (36.7 C)  SpO2: 98%

## 2021-07-26 NOTE — ED Provider Notes (Signed)
[facility] EMERGENCY DEPARTMENT Provider Note   CSN: [id number] Arrival date & time: 07/26/21  1810     History Chief Complaint  Patient presents with   Abscess    Amber King is a 40 y.o. female.  Amber King is a 40 y.o. female with hx of anxiety, PCOS and previous dental abscess requiring emergent I&D in OR,  presents from urgent care with dental infection x2 weeks, worse  over the past 3 days.  Describes 2 weeks of progressively worsening pain L lower molars and jaw, now with throat pain and pain under jaw. With bump on the underside of left lower jaw that has become red and painful without drainage. Denies foul taste in mouth, fevers/chills, pain under tongue. No difficulty breathing or swallowing. Sent from UC with concern for early deep space abscess. Pt reports she was on and antibiotic recently for this which she completed without improvement, but does not know what ABX she was prescribed,unable to see in medical record.  The history is provided by the patient and medical records.  Abscess Associated symptoms: no fever, no headaches, no nausea and no vomiting       Past Medical History:  Diagnosis Date   Panic attacks    PCOS (polycystic ovarian syndrome)    Peptic ulcer disease     Patient Active Problem List   Diagnosis Date Noted   Facial cellulitis 04/26/2020   Tobacco dependence 04/26/2020   Poor dentition 04/26/2020   Pain in limb 07/28/2011    Past Surgical History:  Procedure Laterality Date   INCISION AND DRAINAGE ABSCESS Right 04/26/2020   Procedure: INCISION AND DRAINAGE ABSCESS RIGHT NECK;  Surgeon: Ocie Doyne, DDS;  Location: [facility] OR;  Service: Oral Surgery;  Laterality: Right;   MOUTH SURGERY     TOOTH EXTRACTION N/A 04/26/2020   Procedure: DENTAL RESTORATION/EXTRACTIONS TOOTH #31;  Surgeon: Ocie Doyne, DDS;  Location: [facility] OR;  Service: Oral Surgery;  Laterality: N/A;     OB History   No obstetric history on file.     Family History   Problem Relation Age of Onset   Healthy Mother    Healthy Father     Social History   Tobacco Use   Smoking status: Every Day    Packs/day: 0.50    Years: 25.00    Pack years: 12.50    Types: Cigarettes   Smokeless tobacco: Never  Vaping Use   Vaping Use: Never used  Substance Use Topics   Alcohol use: Not Currently    Alcohol/week: 2.0 standard drinks    Types: 2 Standard drinks or equivalent per week   Drug use: No    Home Medications Prior to Admission medications   Medication Sig Start Date End Date Taking? Authorizing Provider  chlorhexidine (PERIDEX) 0.12 % solution Use as directed 15 mLs in the mouth or throat 2 (two) times daily. 07/26/21  Yes Dartha Lodge, PA-C  clindamycin (CLEOCIN) 300 MG capsule Take 1 capsule (300 mg total) by mouth 4 (four) times daily. X 7 days 07/26/21  Yes Dartha Lodge, PA-C  meloxicam (MOBIC) 7.5 MG tablet Take 1 tablet (7.5 mg total) by mouth daily. 12/27/20   Rhys Martini, PA-C  clonazePAM (KLONOPIN) 0.5 MG tablet Take 0.5 mg by mouth at bedtime as needed.    04/23/20  [provider]  FLUoxetine (PROZAC) 40 MG capsule Take 40 mg by mouth daily.    04/23/20  [provider]  gabapentin (NEURONTIN)  300 MG capsule Take 300 mg by mouth 2 (two) times daily.    04/23/20  [provider]  ranitidine (ZANTAC) 150 MG capsule Take 150 mg by mouth 2 (two) times daily.    04/23/20  [provider]    Allergies    Patient has no known allergies.  Review of Systems   Review of Systems  Constitutional:  Negative for chills and fever.  HENT:  Positive for dental problem, facial swelling and sore throat. Negative for trouble swallowing.   Respiratory:  Negative for shortness of breath and stridor.   Cardiovascular:  Negative for chest pain.  Gastrointestinal:  Negative for nausea and vomiting.  Musculoskeletal:  Negative for neck pain and neck stiffness.  Skin:  Negative for rash.  Neurological:  Negative for  headaches.  All other systems reviewed and are negative.  Physical Exam Updated Vital Signs BP 115/69   Pulse 60   Temp 98.4 F (36.9 C) (Oral)   Resp 18   Ht 5\' 4"  (1.626 m)   Wt 53.4 kg   SpO2 97%   BMI 20.21 kg/m   Physical Exam Vitals and nursing note reviewed.  Constitutional:      General: She is not in acute distress.    Appearance: Normal appearance. She is well-developed and normal weight. She is not ill-appearing or diaphoretic.  HENT:     Head: Normocephalic and atraumatic.     Nose: Nose normal.     Mouth/Throat:     Comments: Tenderness over the left lower molars with palpable swelling of the gums in this area, no sublingual swelling or induration, posterior oropharynx clear, no trismus, no torticollis, tolerating secretions. Just under the jaw on the left there is an area of redness and fluctuance where it appears infection is tracking out, no expressible drainage Eyes:     General:        Right eye: No discharge.        Left eye: No discharge.  Neck:     Comments: No stridor, no torticollis Cardiovascular:     Rate and Rhythm: Normal rate.  Pulmonary:     Effort: Pulmonary effort is normal. No respiratory distress.     Breath sounds: Normal breath sounds.  Musculoskeletal:        General: No deformity.     Cervical back: Neck supple.  Skin:    General: Skin is warm and dry.  Neurological:     Mental Status: She is alert and oriented to person, place, and time.     Coordination: Coordination normal.  Psychiatric:        Mood and Affect: Mood normal.        Behavior: Behavior normal.    ED Results / Procedures / Treatments   Labs (all labs ordered are listed, but only abnormal results are displayed) Labs Reviewed  BASIC METABOLIC PANEL - Abnormal; Notable for the following components:      Result Value   Potassium 3.1 (*)    Glucose, Bld 103 (*)    BUN 5 (*)    Calcium 8.6 (*)    All other components within normal limits  CBC WITH  DIFFERENTIAL/PLATELET - Abnormal; Notable for the following components:   WBC 11.1 (*)    HCT 35.5 (*)    All other components within normal limits  AEROBIC/ANAEROBIC CULTURE W GRAM STAIN (SURGICAL/DEEP WOUND)  PREGNANCY, URINE    EKG None  Radiology CT Maxillofacial W Contrast  Result Date: 07/26/2021 CLINICAL  DATA:  Initial evaluation for maxillofacial abscess. EXAM: CT MAXILLOFACIAL WITH CONTRAST TECHNIQUE: Multidetector CT imaging of the maxillofacial structures was performed with intravenous contrast. Multiplanar CT image reconstructions were also generated. CONTRAST:  101mL OMNIPAQUE IOHEXOL 300 MG/ML  SOLN COMPARISON:  None. FINDINGS: Osseous: Examination degraded by motion artifact. No acute osseous finding.  No discrete or worrisome osseous lesions. Orbits: Globes and orbital soft tissues within normal limits. Sinuses: Paranasal sinuses are clear. Mastoid air cells and middle ear cavities are well pneumatized and free of fluid. Soft tissues: Focal soft tissue swelling with inflammatory stranding seen adjacent to and just inferior to the left mandibular body, concerning for localized infection/cellulitis. Superimposed thick walled hypodense collection at this location measures approximately 9 x 7 x 5 mm, consistent with abscess (series 2, image 59). Overlying focal skin thickening. Poor dentition with multiple scattered dental caries, most notably at the left mandibular molars, likely the source of infection. No extension to into the deeper spaces of the neck at this time. Limited intracranial: Unremarkable. IMPRESSION: 1. Focal soft tissue swelling with inflammatory stranding adjacent to and just inferior to the left mandibular body, consistent with localized infection/cellulitis. Superimposed 9 x 7 x 5 mm abscess as above. 2. Poor dentition with multiple scattered dental caries, likely the source of infection. Electronically Signed   By: Rise Mu M.D.   On: 07/26/2021 21:39      Procedures .Marland KitchenIncision and Drainage  Date/Time: 08/01/2021 2:22 PM Performed by: Dartha Lodge, PA-C Authorized by: Dartha Lodge, PA-C   Consent:    Consent obtained:  Verbal   Consent given by:  Patient   Risks, benefits, and alternatives were discussed: yes     Risks discussed:  Bleeding, damage to other organs, infection, incomplete drainage and pain   Alternatives discussed:  No treatment Universal protocol:    Procedure explained and questions answered to patient or proxy's satisfaction: yes     Imaging studies available: yes     Patient identity confirmed:  Verbally with patient Location:    Type:  Abscess   Location:  Mouth   Mouth location:  Alveolar process Anesthesia:    Anesthesia method:  Local infiltration   Local anesthetic:  Bupivacaine 0.5% w/o epi Procedure type:    Complexity:  Complex Procedure details:    Ultrasound guidance: no     Needle aspiration: yes     Needle size:  18 G   Incision types:  Single straight   Incision depth:  Submucosal   Wound management:  Probed and deloculated and irrigated with saline   Drainage:  Purulent and bloody   Drainage amount:  Moderate   Wound treatment:  Wound left open   Packing materials:  None Post-procedure details:    Procedure completion:  Tolerated well, no immediate complications   Medications Ordered in ED Medications  clindamycin (CLEOCIN) IVPB 600 mg (0 mg Intravenous Stopped 07/26/21 2111)  ketorolac (TORADOL) 30 MG/ML injection 30 mg (30 mg Intravenous Given 07/26/21 2017)  iohexol (OMNIPAQUE) 300 MG/ML solution 75 mL (75 mLs Intravenous Contrast Given 07/26/21 2020)  bupivacaine (MARCAINE) 0.5 % injection 20 mL (20 mLs Infiltration Given by Other 07/26/21 2300)    ED Course  I have reviewed the triage vital signs and the nursing notes.  Pertinent labs & imaging results that were available during my care of the patient were reviewed by me and considered in my medical decision making (see chart  for details).    MDM Rules/Calculators/A&P  Pt presents with dental infection, now with are of redness and fluctuance under the left jaw where it appears infection is tracking out. No current signs of airway compromise, but concern pt may be forming a deep space abscess. She has hx of a submandibular abscess that required I&D. Will get labs and CT maxilofacial to assess extent of infection, pain treated and pt given IV clindamycin.  I have independently ordered, reviewed and interpreted all labs and imaging: CBC: mild leukocytosis, normal Hgb BMP: mild hypokalemia, encouraged increased PO intake indidet, no other electrolyte derangements Preg: neg  CT: focal soft tissue swelling with inflammatory stranding adjacent to and just inferior to the left mandibular body, consistent with localized infection/cellulitis. Superimposed 9 x 7 x 5 mm abscess. Poor dentition with multiple scattered dental caries, likely the source of infection  Case discussed with Dr. Effie Shy who reviewed CT and evaluated ot, recommended bedside I & D and PO antibiotics.  Pt anesthetized, I&D performed and with incision at gumline small amount of purulent drainage noted, also performed aspiration of fluctuance on the face just under the left lower jaw and 3 cc of purulent drainage removed. This fluid was sent for culture. Mouth was irrigated. Pt tolerated procedure well. Will treat with clindamycin and have pt follow up with oral surgery. Pt expresses understanding and agrees with plan. Discharged home in good condition.  Final Clinical Impression(s) / ED Diagnoses Final diagnoses:  Facial abscess  Dental infection    Rx / DC Orders ED Discharge Orders          Ordered    clindamycin (CLEOCIN) 300 MG capsule  4 times daily        07/26/21 2307    chlorhexidine (PERIDEX) 0.12 % solution  2 times daily        07/26/21 2307             Dartha Lodge, PA-C 08/01/21 1423    Mancel Bale, MD 08/02/21 1714

## 2021-07-26 NOTE — ED Provider Notes (Addendum)
  Face-to-face evaluation   History: She presents for evaluation of gradually worsening pain and swelling in left lower jaw, for several days.  History of same on the opposite jaw, 1 week ago.  She denies fever, chills, or inability to eat.  Physical exam: Alert, calm, cooperative.  Visible swelling left angle of mandible.  This is adjacent to swelling of the left inferior buccal, mandible region, consistent with an intraoral abscess from dental source.  No associated adenopathy.  Medical screening examination/treatment/procedure(s) were conducted as a shared visit with non-physician practitioner(s) and myself.  I personally evaluated the patient during the encounter      Mancel Bale, MD 08/02/21 1721

## 2021-07-26 NOTE — ED Notes (Signed)
ED Provider at bedside. 

## 2021-07-26 NOTE — ED Provider Notes (Signed)
[facility]    CSN: [id number] Arrival date & time: 07/26/21  1353      History   Chief Complaint Chief Complaint  Patient presents with   Dental Pain    HPI Amber King is a 40 y.o. female presenting with dental infection x2 weeks, worse x3 days. Medical history facial cellulitis. Describes 2 weeks of progressively worsening pain L lower molars and jaw, now with throat pain and pain under jaw. Denies foul taste in mouth, fevers/chills, pain under tongue.   HPI  Past Medical History:  Diagnosis Date   Panic attacks    PCOS (polycystic ovarian syndrome)    Peptic ulcer disease     Patient Active Problem List   Diagnosis Date Noted   Facial cellulitis 04/26/2020   Tobacco dependence 04/26/2020   Poor dentition 04/26/2020   Pain in limb 07/28/2011    Past Surgical History:  Procedure Laterality Date   INCISION AND DRAINAGE ABSCESS Right 04/26/2020   Procedure: INCISION AND DRAINAGE ABSCESS RIGHT NECK;  Surgeon: Ocie Doyne, DDS;  Location: [facility] OR;  Service: Oral Surgery;  Laterality: Right;   MOUTH SURGERY     TOOTH EXTRACTION N/A 04/26/2020   Procedure: DENTAL RESTORATION/EXTRACTIONS TOOTH #31;  Surgeon: Ocie Doyne, DDS;  Location: [facility] OR;  Service: Oral Surgery;  Laterality: N/A;    OB History   No obstetric history on file.      Home Medications    Prior to Admission medications   Medication Sig Start Date End Date Taking? Authorizing Provider  amoxicillin-clavulanate (AUGMENTIN) 875-125 MG tablet Take 1 tablet by mouth every 12 (twelve) hours. 12/27/20   Rhys Martini, PA-C  meloxicam (MOBIC) 7.5 MG tablet Take 1 tablet (7.5 mg total) by mouth daily. 12/27/20   Rhys Martini, PA-C  clonazePAM (KLONOPIN) 0.5 MG tablet Take 0.5 mg by mouth at bedtime as needed.    04/23/20  [provider]  FLUoxetine (PROZAC) 40 MG capsule Take 40 mg by mouth daily.    04/23/20  [provider]  gabapentin (NEURONTIN) 300 MG capsule Take 300 mg  by mouth 2 (two) times daily.    04/23/20  [provider]  ranitidine (ZANTAC) 150 MG capsule Take 150 mg by mouth 2 (two) times daily.    04/23/20  [provider]    Family History Family History  Problem Relation Age of Onset   Healthy Mother    Healthy Father     Social History Social History   Tobacco Use   Smoking status: Every Day    Packs/day: 0.50    Years: 25.00    Pack years: 12.50    Types: Cigarettes   Smokeless tobacco: Never  Vaping Use   Vaping Use: Never used  Substance Use Topics   Alcohol use: Not Currently    Alcohol/week: 2.0 standard drinks    Types: 2 Standard drinks or equivalent per week   Drug use: No     Allergies   Patient has no known allergies.   Review of Systems Review of Systems  HENT:  Positive for dental problem.   All other systems reviewed and are negative.   Physical Exam Triage Vital Signs ED Triage Vitals  Enc Vitals Group     BP 07/26/21 1649 131/75     Pulse Rate 07/26/21 1649 61     Resp 07/26/21 1649 16     Temp 07/26/21 1649 98 F (36.7 C)     Temp Source 07/26/21  1649 Oral     SpO2 07/26/21 1649 98 %     Weight --      Height --      Head Circumference --      Peak Flow --      Pain Score 07/26/21 1503 9     Pain Loc --      Pain Edu? --      Excl. in GC? --    No data found.  Updated Vital Signs BP 131/75 (BP Location: Right Arm)   Pulse 61   Temp 98 F (36.7 C) (Oral)   Resp 16   LMP  (Within Weeks) Comment: 1 week  SpO2 98%   Visual Acuity Right Eye Distance:   Left Eye Distance:   Bilateral Distance:    Right Eye Near:   Left Eye Near:    Bilateral Near:     Physical Exam Vitals reviewed.  Constitutional:      General: She is not in acute distress.    Appearance: Normal appearance. She is not ill-appearing, toxic-appearing or diaphoretic.  HENT:     Head: Normocephalic and atraumatic.     Jaw: There is normal jaw occlusion. Tenderness and swelling present. No  trismus, pain on movement or malocclusion.     Salivary Glands: Right salivary gland is not diffusely enlarged or tender. Left salivary gland is not diffusely enlarged or tender.     Right Ear: Hearing normal.     Left Ear: Hearing normal.     Nose: Nose normal.     Mouth/Throat:     Lips: Pink.     Mouth: Mucous membranes are moist. No lacerations or oral lesions.     Dentition: Abnormal dentition. Does not have dentures. Dental tenderness, dental caries and dental abscesses present.     Tongue: No lesions. Tongue does not deviate from midline.     Palate: No mass.     Pharynx: Oropharynx is clear. Uvula midline. No oropharyngeal exudate or posterior oropharyngeal erythema.     Tonsils: No tonsillar exudate or tonsillar abscesses.     Comments: Poor dentician  Large abscess visible L jaw and chin. Gingival pain surrounding L lower molars. No pain under tongue.  Eyes:     Extraocular Movements: Extraocular movements intact.     Pupils: Pupils are equal, round, and reactive to light.  Pulmonary:     Effort: Pulmonary effort is normal.  Neurological:     General: No focal deficit present.     Mental Status: She is alert and oriented to person, place, and time.  Psychiatric:        Mood and Affect: Mood normal.        Behavior: Behavior normal.        Thought Content: Thought content normal.        Judgment: Judgment normal.     UC Treatments / Results  Labs (all labs ordered are listed, but only abnormal results are displayed) Labs Reviewed - No data to display  EKG   Radiology No results found.  Procedures Procedures (including critical care time)  Medications Ordered in UC Medications - No data to display  Initial Impression / Assessment and Plan / UC Course  I have reviewed the triage vital signs and the nursing notes.  Pertinent labs & imaging results that were available during my care of the patient were reviewed by me and considered in my medical decision making  (see chart for details).    This  patient is a very pleasant 40 y.o. year old female presenting with dental abscess. Afebrile, nontachy. Significant abscess visible L chin. I do have concern for early Ludwig's Angina given presentation. Sent to ED via personal vehicle, she is in agreement.   Final Clinical Impressions(s) / UC Diagnoses   Final diagnoses:  Pain, dental  Dental infection     Discharge Instructions      -I am very concerned about your dental infection.  This is progressing into a severe infection. If not properly treated, this could result in Ludwig's angina: a life-threatening diffuse cellulitis of the soft tissue of the floor of the mouth and neck.   ED Prescriptions   None    PDMP not reviewed this encounter.   Rhys Martini, PA-C 07/26/21 1717

## 2021-07-27 ENCOUNTER — Telehealth: Payer: Self-pay

## 2021-07-27 NOTE — Telephone Encounter (Signed)
Transition Care Management Unsuccessful Follow-up Telephone Call  Date of discharge and from where:  07/26/2021 from [facility]  Attempts:  1st Attempt  Reason for unsuccessful TCM follow-up call:  Left voice message

## 2021-07-28 NOTE — Telephone Encounter (Signed)
Transition Care Management Unsuccessful Follow-up Telephone Call  Date of discharge and from where:  07/26/2021 from [facility]  Attempts:  2nd Attempt  Reason for unsuccessful TCM follow-up call:  Left voice message

## 2021-07-29 NOTE — Telephone Encounter (Signed)
Transition Care Management Unsuccessful Follow-up Telephone Call  Date of discharge and from where:  07/26/2021 from [facility]  Attempts:  3rd Attempt  Reason for unsuccessful TCM follow-up call:  Unable to reach patient

## 2021-07-31 LAB — AEROBIC/ANAEROBIC CULTURE W GRAM STAIN (SURGICAL/DEEP WOUND)

## 2021-08-01 ENCOUNTER — Telehealth: Payer: Self-pay | Admitting: Emergency Medicine

## 2021-08-01 NOTE — Progress Notes (Signed)
ED Antimicrobial Stewardship Positive Culture Follow Up   Amber King is an 40 y.o. female who presented to [facility] on 07/26/2021 with a chief complaint of  Chief Complaint  Patient presents with   Abscess    Recent Results (from the past 720 hour(s))  Aerobic/Anaerobic Culture w Gram Stain (surgical/deep wound)     Status: None   Collection Time: 07/26/21 10:57 PM   Specimen: Abscess  Result Value Ref Range Status   Specimen Description   Final    ABSCESS Performed at [facility], [address], [place], [place] [address]    Special Requests   Final    NONE Performed at [facility], [address] [place], [place] [address]    Gram Stain   Final    ABUNDANT WBC PRESENT, PREDOMINANTLY PMN FEW GRAM NEGATIVE RODS    Culture   Final    ABUNDANT STREPTOCOCCUS ANGINOSIS ABUNDANT PREVOTELLA DENTICOLA BETA LACTAMASE NEGATIVE Performed at [facility], [address] [place], [place] [address]    Report Status 07/31/2021 FINAL  Final   Organism ID, Bacteria STREPTOCOCCUS ANGINOSIS  Final      Susceptibility   Streptococcus anginosis - MIC*    PENICILLIN <=0.06 SENSITIVE Sensitive     CEFTRIAXONE 0.25 SENSITIVE Sensitive     ERYTHROMYCIN >=8 RESISTANT Resistant     LEVOFLOXACIN <=0.25 SENSITIVE Sensitive     VANCOMYCIN 0.5 SENSITIVE Sensitive     * ABUNDANT STREPTOCOCCUS ANGINOSIS    [x]  Treated with clindamycin, one organism may be resistant to prescribed antimicrobial  New antibiotic prescription: Continue current therapy with clindamycin and add Augmentin 875mg  PO BID x 10 days total therapy for both agents.  ED Provider: , PA-C   [place] 08/01/2021, 9:30 AM Clinical Pharmacist Monday - Friday phone -  [phone] Saturday - Sunday phone - [phone]

## 2021-08-01 NOTE — Telephone Encounter (Signed)
Post ED Visit - Positive Culture Follow-up: Successful Patient Follow-Up  Culture assessed and recommendations reviewed by:  []  , Pharm.D. []  Enzo Bi, .D., BCPS AQ-ID []  Celedonio Miyamoto, Pharm.D., BCPS []  [address], Pharm.D., BCPS []  [place], Garvin Fila.D., BCPS, AAHIVP []  , Pharm.D., BCPS, AAHIVP []  Georgina Pillion, PharmD, BCPS []  , PharmD, BCPS []  [place], PharmD, BCPS []  [address], PharmD PharmD  Positive gram stain  culture  []  Patient discharged without antimicrobial prescription and treatment is now indicated []  Organism is resistant to prescribed ED discharge antimicrobial []  Patient with positive blood cultures  Changes discussed with ED provider: Estella Husk PA New antibiotic prescription continue Clindamycin, start Augmentin 875mg  po bid x 10 days Called to [place] @ [phone]  Contacted pt    08/01/2021, 11:31 AM

## 2021-10-30 ENCOUNTER — Emergency Department (HOSPITAL_COMMUNITY)
Admission: EM | Admit: 2021-10-30 | Discharge: 2021-10-30 | Disposition: A | Discharge status: Home / Self Care | Payer: Medicaid Other | Attending: Emergency Medicine | Admitting: Emergency Medicine

## 2021-10-30 ENCOUNTER — Encounter (HOSPITAL_COMMUNITY): Payer: Self-pay | Admitting: Emergency Medicine

## 2021-10-30 ENCOUNTER — Other Ambulatory Visit: Payer: Self-pay

## 2021-10-30 DIAGNOSIS — K047 Periapical abscess without sinus: Secondary | ICD-10-CM | POA: Insufficient documentation

## 2021-10-30 DIAGNOSIS — K0889 Other specified disorders of teeth and supporting structures: Secondary | ICD-10-CM | POA: Diagnosis present

## 2021-10-30 DIAGNOSIS — Z79899 Other long term (current) drug therapy: Secondary | ICD-10-CM | POA: Insufficient documentation

## 2021-10-30 MED ORDER — AMOXICILLIN-POT CLAVULANATE 875-125 MG PO TABS: 1.0000 | ORAL_TABLET | Freq: Two times a day (BID) | ORAL | 0 refills | Status: AC

## 2021-10-30 MED ORDER — CHLORHEXIDINE GLUCONATE 0.12% ORAL RINSE (MEDLINE KIT): 15.0000 mL | Freq: Two times a day (BID) | OROMUCOSAL | 0 refills | Status: AC

## 2021-10-30 NOTE — Discharge Instructions (Signed)
You were given a prescription for antibiotics. Please take the antibiotic prescription fully.   Please follow-up with a dentist in the next 5 to 7 days for reevaluation.  If you do not have a dentist, resources were provided for dentist in the area in your discharge summary.  Please contact one of the offices that are listed and make an appointment for follow-up.  Please return to the emergency department for any new or worsening symptoms.  

## 2021-10-30 NOTE — ED Triage Notes (Signed)
Pt to the ED with complaints of left uppers dental abscess x 2 days.

## 2021-10-30 NOTE — ED Provider Notes (Signed)
Amber King   CSN: [id number] Arrival date & time: 10/30/21  4008     History  Chief Complaint  Patient presents with   Dental Pain    Amber King is a 41 y.o. female.  HPI  42 year old female with a history of panic attacks, PCOS, peptic ulcer disease, tooth extraction, who presents to the emergency department today for evaluation of facial swelling and dental pain that has been ongoing for the last 24 to 48 hours.  She states she has a dental abscess with swelling along the gums.  She had no fevers chills or other associated complaints.  Home Medications Prior to Admission medications   Medication Sig Start Date End Date Taking? Authorizing Provider  amoxicillin-clavulanate (AUGMENTIN) 875-125 MG tablet Take 1 tablet by mouth 2 (two) times daily for 7 days. 10/30/21 11/06/21 Yes Lysette Lindenbaum S, PA-C  chlorhexidine gluconate, MEDLINE KIT, (PERIDEX) 0.12 % solution Use as directed 15 mLs in the mouth or throat 2 (two) times daily. 10/30/21  Yes Octavion Mollenkopf S, PA-C  chlorhexidine (PERIDEX) 0.12 % solution Use as directed 15 mLs in the mouth or throat 2 (two) times daily. 07/26/21   Jacqlyn Larsen, PA-C  clindamycin (CLEOCIN) 300 MG capsule Take 1 capsule (300 mg total) by mouth 4 (four) times daily. X 7 days 07/26/21   Jacqlyn Larsen, PA-C  meloxicam (MOBIC) 7.5 MG tablet Take 1 tablet (7.5 mg total) by mouth daily. 12/27/20   Hazel Sams, PA-C  clonazePAM (KLONOPIN) 0.5 MG tablet Take 0.5 mg by mouth at bedtime as needed.    04/23/20  [provider]  FLUoxetine (PROZAC) 40 MG capsule Take 40 mg by mouth daily.    04/23/20  [provider]  gabapentin (NEURONTIN) 300 MG capsule Take 300 mg by mouth 2 (two) times daily.    04/23/20  [provider]  ranitidine (ZANTAC) 150 MG capsule Take 150 mg by mouth 2 (two) times daily.    04/23/20  [provider]      Allergies    Patient has no known allergies.     Review of Systems   Review of Systems See HPI for pertinent positives or negatives.   Physical Exam Updated Vital Signs BP 114/71 (BP Location: Right Arm)    Pulse 75    Temp 98.4 F (36.9 C) (Oral)    Resp 17    Ht _0  (1.626 m)    Wt 53.4 kg    LMP 10/21/2021 (Approximate)    SpO2 100%    BMI 20.21 kg/m  Physical Exam Vitals and nursing King reviewed.  Constitutional:      General: She is not in acute distress.    Appearance: She is well-developed.  HENT:     Head: Normocephalic and atraumatic.     Mouth/Throat:     Comments: Poor dentition, multiple dental caries. 1cm area of fluctuance noted along the left upper gumline. Swelling noted to the left side of the face as well. No trismus. No sublingual or submandibular swelling. Eyes:     Conjunctiva/sclera: Conjunctivae normal.  Cardiovascular:     Rate and Rhythm: Normal rate.  Pulmonary:     Effort: Pulmonary effort is normal.  Musculoskeletal:        General: Normal range of motion.     Cervical back: Neck supple.  Skin:    General: Skin is warm and dry.  Neurological:     Mental Status: She is  alert.    ED Results / Procedures / Treatments   Labs (all labs ordered are listed, but only abnormal results are displayed) Labs Reviewed - No data to display  EKG None  Radiology No results found.  Procedures .Marland KitchenIncision and Drainage  Date/Time: 10/30/2021 10:02 AM Performed by: Rodney Booze, PA-C Authorized by: Rodney Booze, PA-C   Consent:    Consent obtained:  Verbal   Consent given by:  Patient   Risks, benefits, and alternatives were discussed: yes     Risks discussed:  Bleeding, incomplete drainage, pain and infection   Alternatives discussed:  No treatment Universal protocol:    Procedure explained and questions answered to patient or proxy's satisfaction: yes     Patient identity confirmed:  Verbally with patient Location:    Type:  Abscess   Size:  1   Location:  Mouth   Mouth  location:  Alveolar process Sedation:    Sedation type:  None Anesthesia:    Anesthesia method:  None Procedure type:    Complexity:  Simple Procedure details:    Ultrasound guidance: no     Needle aspiration: no     Incision types:  Stab incision   Incision depth:  Submucosal   Drainage:  Purulent   Drainage amount:  Moderate   Wound treatment:  Wound left open   Packing materials:  None Post-procedure details:    Procedure completion:  Tolerated    Medications Ordered in ED Medications - No data to display  ED Course/ Medical Decision Making/ A&P                           Medical Decision Making  This patient presents to the ED for concern of dental abscess, this involves an extensive number of treatment options, and is a complaint that carries with it a high risk of complications and morbidity.  The differential diagnosis includes dental abscess, ludwigs angina   Comorbidities that complicate the patient evaluation: Patients presentation is complicated by their history of poor dentition  Social Determinants of Health: Patients  lack of a dentist   increases the complexity of managing their presentation  Additional history obtained: Records reviewed Care Everywhere/External Records  Test Considered: CT soft tissue neck Do not feel that CT is emergently indicated due to no trismus, sublingual or submandibular swelling. Low suspicion for deep space abscess  Critical Interventions: I&D of abscess  Complexity of problems addressed: Patients presentation is most consistent with  acute, uncomplicated illness  Disposition: After consideration of the diagnostic results and the patients response to treatment,  I feel that the patent would benefit from discharge with augmentin and peridex. Information for dentist given. Advised on f/u plan and return precautions. All questions answered, pt stable for discharge .    Final Clinical Impression(s) / ED Diagnoses Final  diagnoses:  Dental abscess    Rx / DC Orders ED Discharge Orders          Ordered    amoxicillin-clavulanate (AUGMENTIN) 875-125 MG tablet  2 times daily        10/30/21 1001    chlorhexidine gluconate, MEDLINE KIT, (PERIDEX) 0.12 % solution  2 times daily        10/30/21 [address] Malanie Koloski S, PA-C 10/30/21 1002    Fredia Sorrow, MD 11/03/21 2350

## 2022-09-06 IMAGING — CT CT MAXILLOFACIAL W/ CM
3 series · 16 of 47 positions shown, 19 images · IV contrast (omnipaque)
Comparison: None.

CLINICAL DATA: Initial evaluation for maxillofacial abscess.

EXAM:
CT MAXILLOFACIAL WITH CONTRAST
TECHNIQUE: Multidetector CT imaging of the maxillofacial structures was
performed with intravenous contrast. Multiplanar CT image
reconstructions were also generated.
CONTRAST:  75mL OMNIPAQUE IOHEXOL 300 MG/ML  SOLN

[Series 2: max soft · axial · 0.32mm/px · z∈[-20,+106]mm · 10 of 73 slices shown, 13 images]
[im 5/73  brain]
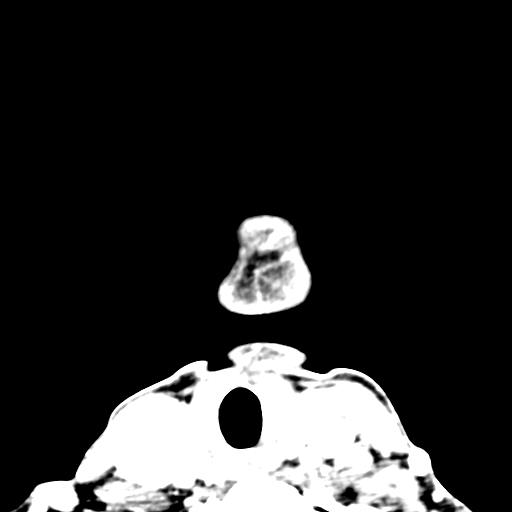
[im 5/73  bone]
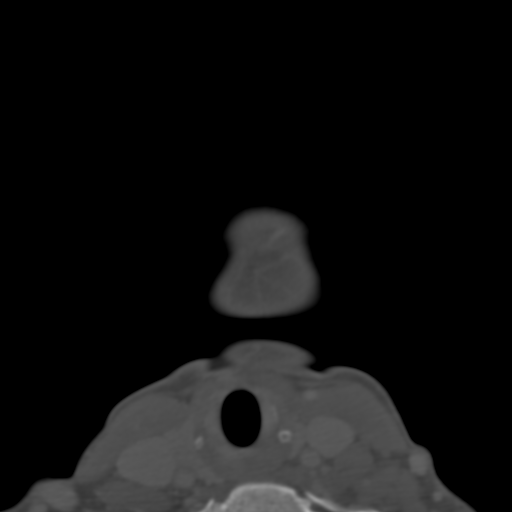
[im 13/73  bone]
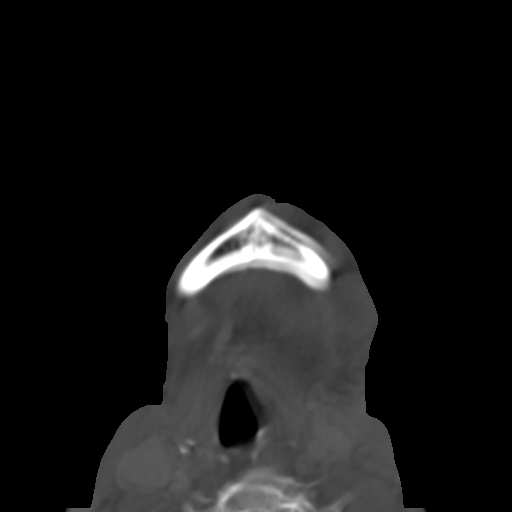
[im 20/73  bone]
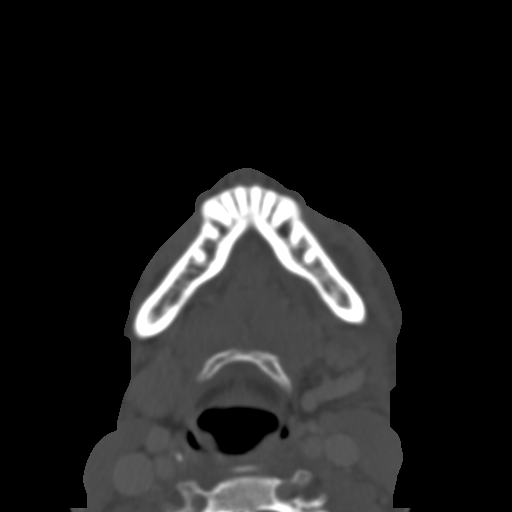
[im 25/73  bone]
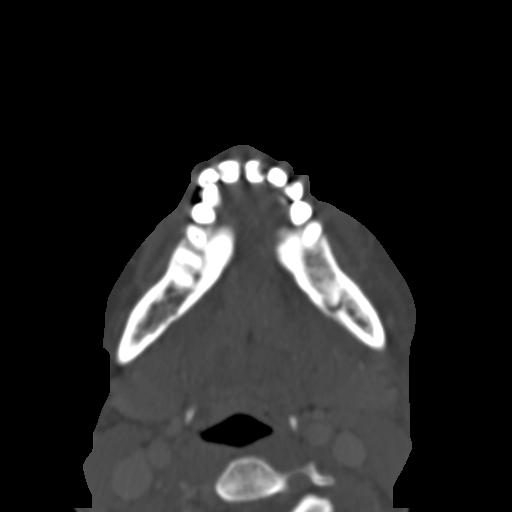
[im 33/73  brain]
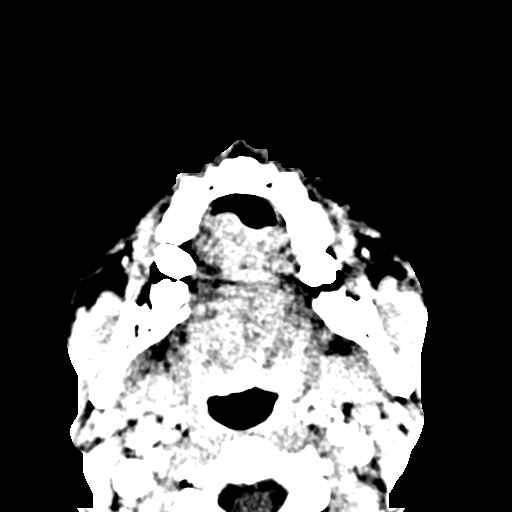
[im 33/73  bone]
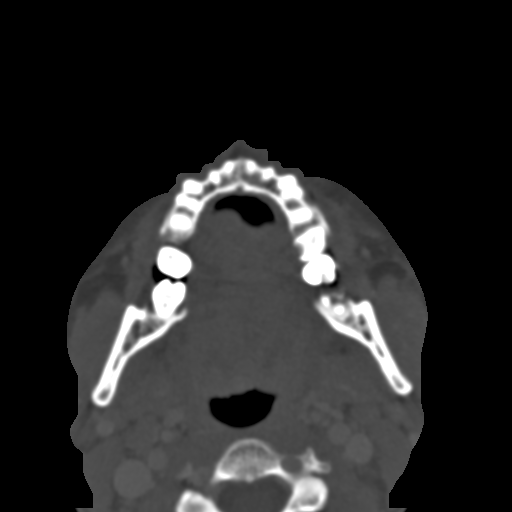
[im 40/73  bone]
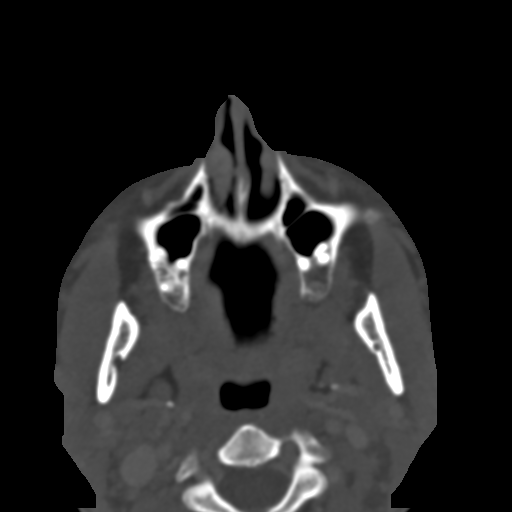
[im 48/73  bone]
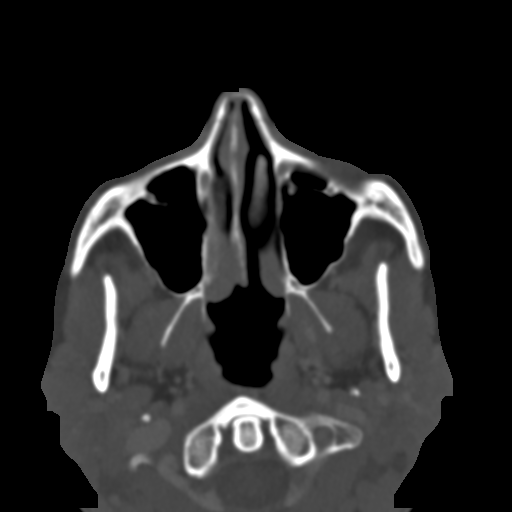
[im 55/73  bone]
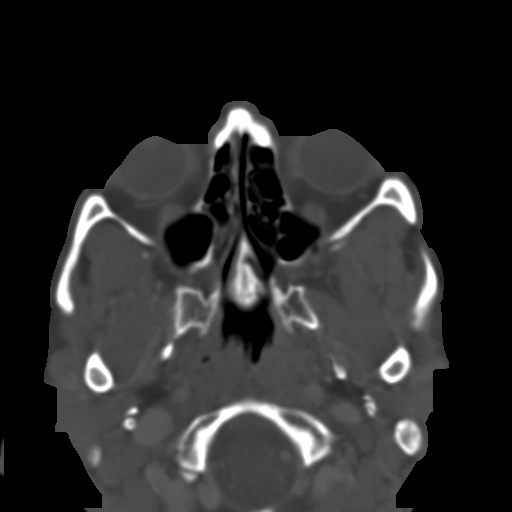
[im 60/73  brain]
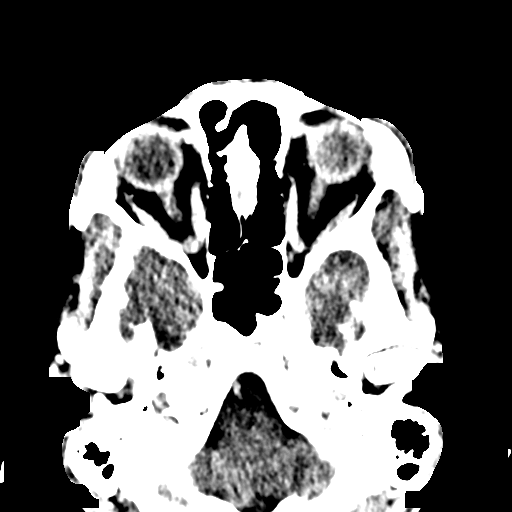
[im 60/73  bone]
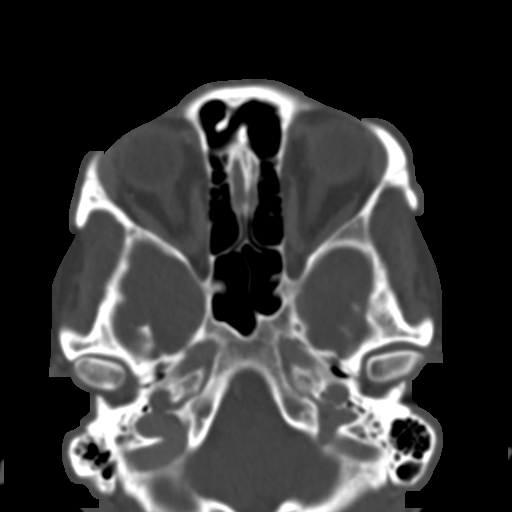
[im 68/73  bone]
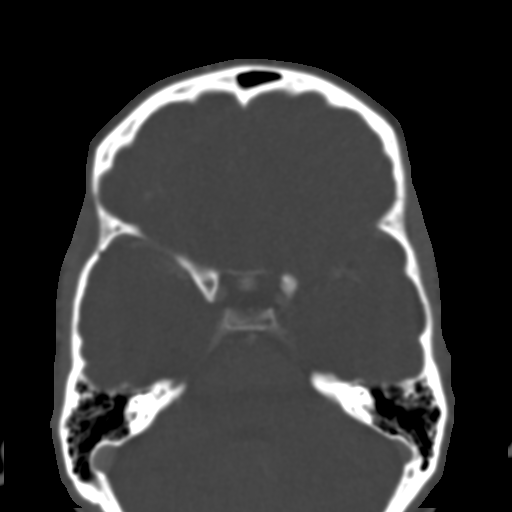

[Series 6: coronal soft · coronal · 0.32mm/px · 3 of 65 slices shown]
[im 22/65  bone]
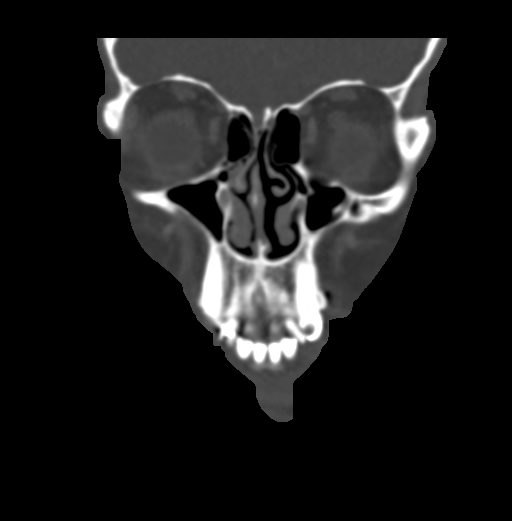
[im 29/65  bone]
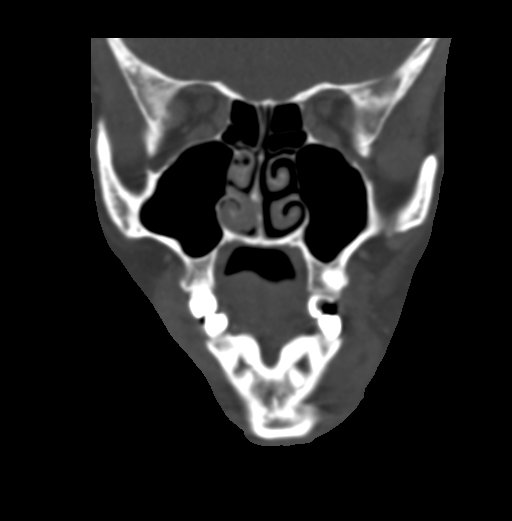
[im 36/65  bone]
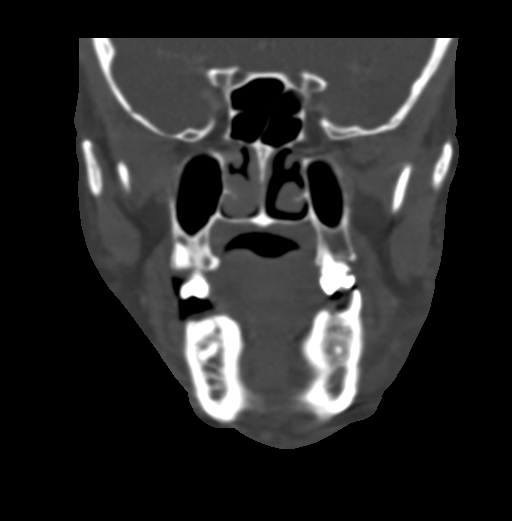

[Series 7: sagittal soft · sagittal · 0.34mm/px · 3 of 71 slices shown]
[im 24/71  bone]
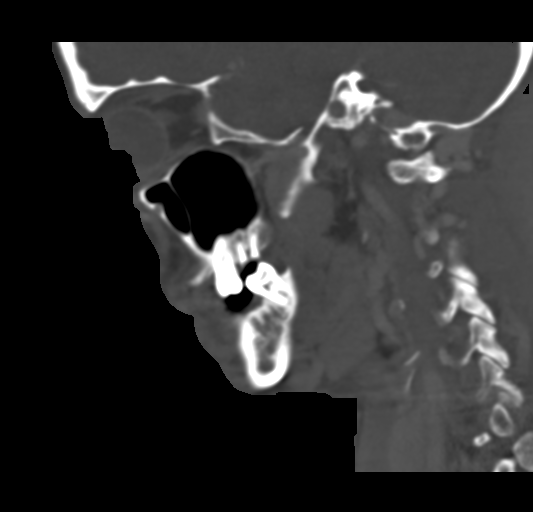
[im 36/71  bone]
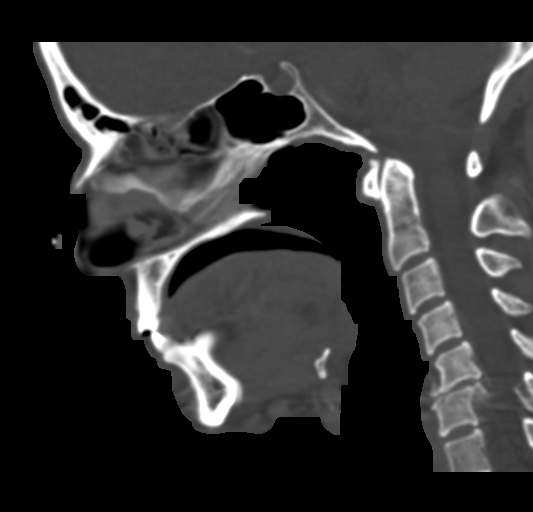
[im 47/71  bone]
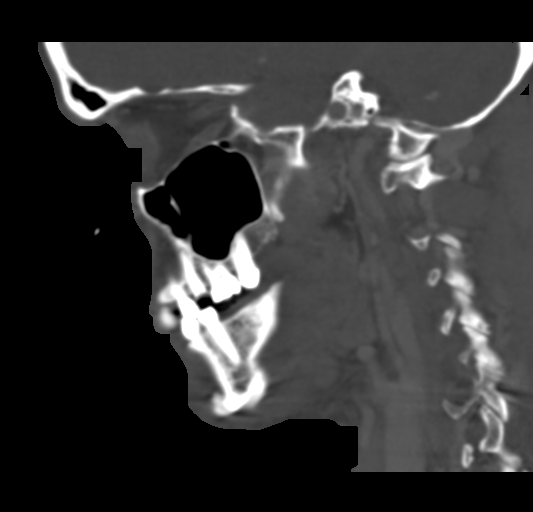

[16 of 47 positions shown; findings below may reference images not displayed]

FINDINGS: Osseous: Examination degraded by motion artifact.

No acute osseous finding.  No discrete or worrisome osseous lesions.

Orbits: Globes and orbital soft tissues within normal limits.

Sinuses: Paranasal sinuses are clear. Mastoid air cells and middle
ear cavities are well pneumatized and free of fluid.

Soft tissues: Focal soft tissue swelling with inflammatory stranding
seen adjacent to and just inferior to the left mandibular body,
concerning for localized infection/cellulitis. Superimposed thick
walled hypodense collection at this location measures approximately
9 x 7 x 5 mm, consistent with abscess (series 2, image 59).
Overlying focal skin thickening. Poor dentition with multiple
scattered dental caries, most notably at the left mandibular molars,
likely the source of infection. No extension to into the deeper
spaces of the neck at this time.

Limited intracranial: Unremarkable.
IMPRESSION: 1. Focal soft tissue swelling with inflammatory stranding adjacent
to and just inferior to the left mandibular body, consistent with
localized infection/cellulitis. Superimposed 9 x 7 x 5 mm abscess as
above.
2. Poor dentition with multiple scattered dental caries, likely the
source of infection.

## 2022-11-14 ENCOUNTER — Encounter (HOSPITAL_COMMUNITY): Payer: Self-pay

## 2022-11-14 ENCOUNTER — Emergency Department (HOSPITAL_COMMUNITY)
Admission: EM | Admit: 2022-11-14 | Discharge: 2022-11-14 | Disposition: A | Discharge status: Home / Self Care | Payer: Medicaid Other | Attending: Emergency Medicine | Admitting: Emergency Medicine

## 2022-11-14 ENCOUNTER — Other Ambulatory Visit: Payer: Self-pay

## 2022-11-14 DIAGNOSIS — K047 Periapical abscess without sinus: Secondary | ICD-10-CM

## 2022-11-14 DIAGNOSIS — K0889 Other specified disorders of teeth and supporting structures: Secondary | ICD-10-CM

## 2022-11-14 MED ORDER — CLINDAMYCIN HCL 300 MG PO CAPS: 300.0000 mg | ORAL_CAPSULE | Freq: Four times a day (QID) | ORAL | 0 refills | Status: DC

## 2022-11-14 NOTE — ED Provider Notes (Signed)
[facility] Provider Note   CSN: [id number] Arrival date & time: 11/14/22  1627     History  Chief Complaint  Patient presents with   Dental Pain    Amber King is a 42 y.o. female.  Patient complains of a dental abscess to her lower gumline.  Patient is requesting a prescription for antibiotics.  Patient reports she has had a history of dental infections in the past she does not currently have a dentist.  Patient denies any fever or chills she denies any facial swelling she does not have any difficulty swallowing   Dental Pain Location:  Lower Lower teeth location:  17/LL 3rd molar Onset quality:  Gradual Timing:  Constant Progression:  Worsening Chronicity:  New Relieved by:  Nothing Worsened by:  Nothing Associated symptoms: no fever        Home Medications Prior to Admission medications   Medication Sig Start Date End Date Taking? Authorizing Provider  chlorhexidine (PERIDEX) 0.12 % solution Use as directed 15 mLs in the mouth or throat 2 (two) times daily. 07/26/21   Jacqlyn Larsen, PA-C  chlorhexidine gluconate, MEDLINE KIT, (PERIDEX) 0.12 % solution Use as directed 15 mLs in the mouth or throat 2 (two) times daily. 10/30/21   Couture, Cortni S, PA-C  clindamycin (CLEOCIN) 300 MG capsule Take 1 capsule (300 mg total) by mouth 4 (four) times daily. X 7 days 11/14/22   Fransico Meadow, PA-C  meloxicam (MOBIC) 7.5 MG tablet Take 1 tablet (7.5 mg total) by mouth daily. 12/27/20   Hazel Sams, PA-C  clonazePAM (KLONOPIN) 0.5 MG tablet Take 0.5 mg by mouth at bedtime as needed.    04/23/20  [provider]  FLUoxetine (PROZAC) 40 MG capsule Take 40 mg by mouth daily.    04/23/20  [provider]  gabapentin (NEURONTIN) 300 MG capsule Take 300 mg by mouth 2 (two) times daily.    04/23/20  [provider]  ranitidine (ZANTAC) 150 MG capsule Take 150 mg by mouth 2 (two) times daily.    04/23/20  [provider]      Allergies    Patient has no known allergies.    Review of Systems   Review of Systems  Constitutional:  Negative for fever.  All other systems reviewed and are negative.   Physical Exam Updated Vital Signs BP 117/75 (BP Location: Right Arm)   Pulse (!) 109   Temp 98.3 F (36.8 C) (Oral)   Resp 18   Ht '5\' 4"'$  (1.626 m)   Wt 53.1 kg   SpO2 97%   BMI 20.08 kg/m  Physical Exam Vitals reviewed.  Constitutional:      Appearance: Normal appearance.  HENT:     Mouth/Throat:     Mouth: Mucous membranes are moist.     Comments: Swollen area lower inner gumline  Musculoskeletal:        General: Normal range of motion.  Skin:    General: Skin is warm.  Neurological:     General: No focal deficit present.     Mental Status: She is alert.  Psychiatric:        Mood and Affect: Mood normal.     ED Results / Procedures / Treatments   Labs (all labs ordered are listed, but only abnormal results are displayed) Labs Reviewed - No data to display  EKG None  Radiology No results found.  Procedures Procedures  Medications Ordered in ED Medications - No data to display  ED Course/ Medical Decision Making/ A&P                             Medical Decision Making Pt complains of dental swelling and pain   Amount and/or Complexity of Data Reviewed Independent Historian: parent  Risk Prescription drug management. Risk Details: Pt given rx for clindamycin            Final Clinical Impression(s) / ED Diagnoses Final diagnoses:  Toothache  Dental abscess    Rx / DC Orders ED Discharge Orders          Ordered    clindamycin (CLEOCIN) 300 MG capsule  4 times daily        11/14/22 1653           An After Visit Summary was printed and given to the patient.    Fransico Meadow, [place] 11/14/22 1749    Sherwood Gambler, MD 11/17/22 [phone]

## 2022-11-14 NOTE — Discharge Instructions (Addendum)
Warm salt water gargles.

## 2022-11-14 NOTE — ED Triage Notes (Signed)
Patient reports has a dental abscess on left lower molar and needs antibiotics.  Patient reports she does not have a dentist,

## 2023-07-20 ENCOUNTER — Ambulatory Visit
Admission: EM | Admit: 2023-07-20 | Discharge: 2023-07-20 | Disposition: A | Discharge status: Home / Self Care | Payer: Medicaid Other | Attending: Family Medicine | Admitting: Family Medicine

## 2023-07-20 DIAGNOSIS — J208 Acute bronchitis due to other specified organisms: Secondary | ICD-10-CM

## 2023-07-20 MED ORDER — PREDNISONE 20 MG PO TABS: 40.0000 mg | ORAL_TABLET | Freq: Every day | ORAL | 0 refills | Status: AC

## 2023-07-20 MED ORDER — PROMETHAZINE-DM 6.25-15 MG/5ML PO SYRP: 5.0000 mL | ORAL_SOLUTION | Freq: Four times a day (QID) | ORAL | 0 refills | Status: AC | PRN

## 2023-07-20 NOTE — ED Provider Notes (Signed)
[facility]    CSN: [id number] Arrival date & time: 07/20/23  1214      History   Chief Complaint No chief complaint on file.   HPI Amber King is a 42 y.o. female.   Patient presenting today with 1 week history of productive cough worse overnight.  States she initially had cold symptoms as well but those have improved, just the cough lingering.  Denies fever, chills, chest pain, shortness of breath, abdominal pain, nausea vomiting or diarrhea.  No known history of chronic pulmonary disease.  Taking over-the-counter medications such as Mucinex and DayQuil with minimal relief.    Past Medical History:  Diagnosis Date   Panic attacks    PCOS (polycystic ovarian syndrome)    Peptic ulcer disease     Patient Active Problem List   Diagnosis Date Noted   Facial cellulitis 04/26/2020   Tobacco dependence 04/26/2020   Poor dentition 04/26/2020   Pain in limb 07/28/2011    Past Surgical History:  Procedure Laterality Date   INCISION AND DRAINAGE ABSCESS Right 04/26/2020   Procedure: INCISION AND DRAINAGE ABSCESS RIGHT NECK;  Surgeon: Ocie Doyne, DDS;  Location: [facility] OR;  Service: Oral Surgery;  Laterality: Right;   MOUTH SURGERY     TOOTH EXTRACTION N/A 04/26/2020   Procedure: DENTAL RESTORATION/EXTRACTIONS TOOTH #31;  Surgeon: Ocie Doyne, DDS;  Location: [facility] OR;  Service: Oral Surgery;  Laterality: N/A;    OB History   No obstetric history on file.      Home Medications    Prior to Admission medications   Medication Sig Start Date End Date Taking? Authorizing Provider  predniSONE (DELTASONE) 20 MG tablet Take 2 tablets (40 mg total) by mouth daily with breakfast. 07/20/23  Yes Particia Nearing, PA-C  promethazine-dextromethorphan (PROMETHAZINE-DM) 6.25-15 MG/5ML syrup Take 5 mLs by mouth 4 (four) times daily as needed. 07/20/23  Yes Particia Nearing, PA-C  chlorhexidine (PERIDEX) 0.12 % solution Use as directed 15 mLs in the mouth or throat 2  (two) times daily. 07/26/21   Dartha Lodge, PA-C  chlorhexidine gluconate, MEDLINE KIT, (PERIDEX) 0.12 % solution Use as directed 15 mLs in the mouth or throat 2 (two) times daily. 10/30/21   Couture, Cortni S, PA-C  clindamycin (CLEOCIN) 300 MG capsule Take 1 capsule (300 mg total) by mouth 4 (four) times daily. X 7 days 11/14/22   Elson Areas, PA-C  meloxicam (MOBIC) 7.5 MG tablet Take 1 tablet (7.5 mg total) by mouth daily. 12/27/20   Rhys Martini, PA-C  clonazePAM (KLONOPIN) 0.5 MG tablet Take 0.5 mg by mouth at bedtime as needed.    04/23/20  [provider]  FLUoxetine (PROZAC) 40 MG capsule Take 40 mg by mouth daily.    04/23/20  [provider]  gabapentin (NEURONTIN) 300 MG capsule Take 300 mg by mouth 2 (two) times daily.    04/23/20  [provider]  ranitidine (ZANTAC) 150 MG capsule Take 150 mg by mouth 2 (two) times daily.    04/23/20  [provider]    Family History Family History  Problem Relation Age of Onset   Healthy Mother    Healthy Father     Social History Social History   Tobacco Use   Smoking status: Every Day    Current packs/day: 0.50    Average packs/day: 0.5 packs/day for 25.0 years (12.5 ttl pk-yrs)    Types: Cigarettes   Smokeless tobacco: Never  Vaping Use  Vaping status: Never Used  Substance Use Topics   Alcohol use: Not Currently    Alcohol/week: 2.0 standard drinks of alcohol    Types: 2 Standard drinks or equivalent per week   Drug use: No     Allergies   Patient has no known allergies.   Review of Systems Review of Systems Per HPI  Physical Exam Triage Vital Signs ED Triage Vitals  Encounter Vitals Group     BP 07/20/23 1227 112/78     Systolic BP Percentile --      Diastolic BP Percentile --      Pulse Rate 07/20/23 1227 66     Resp 07/20/23 1227 14     Temp 07/20/23 1227 98.5 F (36.9 C)     Temp Source 07/20/23 1227 Oral     SpO2 07/20/23 1227 96 %     Weight --      Height --       Head Circumference --      Peak Flow --      Pain Score 07/20/23 1228 0     Pain Loc --      Pain Education --      Exclude from Growth Chart --    No data found.  Updated Vital Signs BP 112/78 (BP Location: Right Arm)   Pulse 66   Temp 98.5 F (36.9 C) (Oral)   Resp 14   LMP 07/15/2023 Comment: start date  SpO2 96%   Visual Acuity Right Eye Distance:   Left Eye Distance:   Bilateral Distance:    Right Eye Near:   Left Eye Near:    Bilateral Near:     Physical Exam Vitals and nursing note reviewed.  Constitutional:      Appearance: Normal appearance.  HENT:     Head: Atraumatic.     Right Ear: Tympanic membrane and external ear normal.     Left Ear: Tympanic membrane and external ear normal.     Nose: Nose normal.     Mouth/Throat:     Mouth: Mucous membranes are moist.     Pharynx: No posterior oropharyngeal erythema.  Eyes:     Extraocular Movements: Extraocular movements intact.     Conjunctiva/sclera: Conjunctivae normal.  Cardiovascular:     Rate and Rhythm: Normal rate and regular rhythm.     Heart sounds: Normal heart sounds.  Pulmonary:     Effort: Pulmonary effort is normal.     Breath sounds: Normal breath sounds. No wheezing or rales.  Musculoskeletal:        General: Normal range of motion.     Cervical back: Normal range of motion and neck supple.  Skin:    General: Skin is warm and dry.  Neurological:     Mental Status: She is alert and oriented to person, place, and time.  Psychiatric:        Mood and Affect: Mood normal.        Thought Content: Thought content normal.      UC Treatments / Results  Labs (all labs ordered are listed, but only abnormal results are displayed) Labs Reviewed - No data to display  EKG   Radiology No results found.  Procedures Procedures (including critical care time)  Medications Ordered in UC Medications - No data to display  Initial Impression / Assessment and Plan / UC Course  I have reviewed  the triage vital signs and the nursing notes.  Pertinent labs & imaging results that  were available during my care of the patient were reviewed by me and considered in my medical decision making (see chart for details).     Given lingering cough, will treat for viral bronchitis with short course of prednisone, Phenergan DM, supportive over-the-counter medications and home care.  No evidence of secondary bacterial infection today.  Return for worsening symptoms.  Note given.  Final Clinical Impressions(s) / UC Diagnoses   Final diagnoses:  Viral bronchitis   Discharge Instructions   None    ED Prescriptions     Medication Sig Dispense Auth. Provider   predniSONE (DELTASONE) 20 MG tablet Take 2 tablets (40 mg total) by mouth daily with breakfast. 10 tablet Particia Nearing, PA-C   promethazine-dextromethorphan (PROMETHAZINE-DM) 6.25-15 MG/5ML syrup Take 5 mLs by mouth 4 (four) times daily as needed. 100 mL Particia Nearing, [place]      PDMP not reviewed this encounter.   Particia Nearing, [place] 07/20/23 1414

## 2023-07-20 NOTE — ED Triage Notes (Signed)
Pt reports chest congestion that worsens at night and morning time x 1 week     Took mucinex cold and dayquil

## 2024-06-05 ENCOUNTER — Emergency Department (HOSPITAL_COMMUNITY)
Admission: EM | Admit: 2024-06-05 | Discharge: 2024-06-05 | Disposition: A | Discharge status: Home / Self Care | Attending: Emergency Medicine | Admitting: Emergency Medicine

## 2024-06-05 ENCOUNTER — Other Ambulatory Visit: Payer: Self-pay

## 2024-06-05 ENCOUNTER — Encounter (HOSPITAL_COMMUNITY): Payer: Self-pay

## 2024-06-05 DIAGNOSIS — K047 Periapical abscess without sinus: Secondary | ICD-10-CM | POA: Diagnosis not present

## 2024-06-05 DIAGNOSIS — K0889 Other specified disorders of teeth and supporting structures: Secondary | ICD-10-CM | POA: Diagnosis present

## 2024-06-05 MED ORDER — AMOXICILLIN-POT CLAVULANATE 875-125 MG PO TABS: 1.0000 | ORAL_TABLET | Freq: Two times a day (BID) | ORAL | 0 refills | Status: DC

## 2024-06-05 MED ORDER — AMOXICILLIN-POT CLAVULANATE 875-125 MG PO TABS
1.0000 | ORAL_TABLET | Freq: Once | ORAL | Status: AC
  Administered 2024-06-05: 1 via ORAL
  Filled 2024-06-05: qty 1

## 2024-06-05 MED ORDER — BUPIVACAINE-EPINEPHRINE (PF) 0.5% -1:200000 IJ SOLN
1.8000 mL | Freq: Once | INTRAMUSCULAR | Status: AC
  Administered 2024-06-05: 1.8 mL
  Filled 2024-06-05: qty 1.8

## 2024-06-05 MED ORDER — AMOXICILLIN-POT CLAVULANATE 875-125 MG PO TABS: 1.0000 | ORAL_TABLET | Freq: Two times a day (BID) | ORAL | 0 refills | Status: AC

## 2024-06-05 NOTE — Discharge Instructions (Addendum)
 It was a pleasure taking care of you today.  You were seen for a dental abscess that we drained in the ER.  We are prescribing antibiotics for home.  It is important for you to make an appointment with a dentist.  We do not have a dentist on-call today but I including a list of dental resources.

## 2024-06-05 NOTE — ED Triage Notes (Signed)
 Pt arrived via POV c/o left lower dental abscess and pain. Pt reports trying to drain the abscess w/o relief. Pt reports trying expired Amoxicillin  at home w/o relief.

## 2024-06-05 NOTE — ED Provider Notes (Signed)
 [place] EMERGENCY DEPARTMENT AT [facility] Provider Note   CSN: [id number] Arrival date & time: 06/05/24  1629     Patient presents with: Dental Pain   Amber King is a 43 y.o. female.  She has history of recurrent dental abscesses but is otherwise healthy.  Presents to the ER for right lower dental pain and swelling that started about a week ago, she took 5 days worth of leftover expired amoxicillin  and she states the swelling went down but soon as she stopped it came back, yesterday she used a sterile needle from work to try to drain the abscess but states it did not help.  Swelling continues to be worse.  She states she has had some chills but no documented fevers, no trouble swallowing or breathing.    Dental Pain      Prior to Admission medications   Medication Sig Start Date End Date Taking? Authorizing Provider  amoxicillin -clavulanate (AUGMENTIN ) 875-125 MG tablet Take 1 tablet by mouth every 12 (twelve) hours. 06/05/24   Suellen Cantor A, PA-C  chlorhexidine  (PERIDEX ) 0.12 % solution Use as directed 15 mLs in the mouth or throat 2 (two) times daily. 07/26/21   Alva Larraine FALCON, PA-C  chlorhexidine  gluconate, MEDLINE KIT, (PERIDEX ) 0.12 % solution Use as directed 15 mLs in the mouth or throat 2 (two) times daily. 10/30/21   Couture, Cortni S, PA-C  meloxicam  (MOBIC ) 7.5 MG tablet Take 1 tablet (7.5 mg total) by mouth daily. 12/27/20   Graham, Laura E, PA-C  predniSONE  (DELTASONE ) 20 MG tablet Take 2 tablets (40 mg total) by mouth daily with breakfast. 07/20/23   Stuart Vernell Norris, PA-C  promethazine -dextromethorphan (PROMETHAZINE -DM) 6.25-15 MG/5ML syrup Take 5 mLs by mouth 4 (four) times daily as needed. 07/20/23   Stuart Vernell Norris, PA-C  clonazePAM (KLONOPIN) 0.5 MG tablet Take 0.5 mg by mouth at bedtime as needed.    04/23/20  [provider]  FLUoxetine (PROZAC) 40 MG capsule Take 40 mg by mouth daily.    04/23/20  [provider]   gabapentin (NEURONTIN) 300 MG capsule Take 300 mg by mouth 2 (two) times daily.    04/23/20  [provider]  ranitidine (ZANTAC) 150 MG capsule Take 150 mg by mouth 2 (two) times daily.    04/23/20  [provider]    Allergies: Patient has no known allergies.    Review of Systems  Updated Vital Signs BP 127/76 (BP Location: Right Arm)   Pulse 90   Temp 98 F (36.7 C) (Temporal)   Resp 18   Ht 5' 4 (1.626 m)   Wt 64.4 kg   LMP 05/15/2024 (Approximate)   SpO2 99%   BMI 24.37 kg/m   Physical Exam Vitals and nursing note reviewed.  Constitutional:      General: She is not in acute distress.    Appearance: She is well-developed.  HENT:     Head: Normocephalic and atraumatic.     Mouth/Throat:     Mouth: Mucous membranes are moist.     Dentition: Dental caries and dental abscesses present.     Tongue: No lesions. Tongue does not deviate from midline.     Tonsils: No tonsillar exudate or tonsillar abscesses.     Comments: There is fluctuance along the right lower gingiva with multiple carious teeth in this area. Eyes:     Conjunctiva/sclera: Conjunctivae normal.  Cardiovascular:     Rate and Rhythm: Normal rate and regular rhythm.  Heart sounds: No murmur heard. Pulmonary:     Effort: Pulmonary effort is normal. No respiratory distress.     Breath sounds: Normal breath sounds.  Abdominal:     Palpations: Abdomen is soft.     Tenderness: There is no abdominal tenderness.  Musculoskeletal:        General: No swelling.     Cervical back: Neck supple.  Skin:    General: Skin is warm and dry.     Capillary Refill: Capillary refill takes less than 2 seconds.  Neurological:     General: No focal deficit present.     Mental Status: She is alert and oriented to person, place, and time.  Psychiatric:        Mood and Affect: Mood normal.     (all labs ordered are listed, but only abnormal results are displayed) Labs Reviewed - No data to  display  EKG: None  Radiology: No results found.   .Incision and Drainage  Date/Time: 06/05/2024 6:31 PM  Performed by: Suellen Sherran LABOR, PA-C Authorized by: Suellen Sherran LABOR, PA-C   Consent:    Consent obtained:  Verbal   Consent given by:  Patient   Risks discussed:  Bleeding, incomplete drainage and pain   Alternatives discussed:  Alternative treatment Universal protocol:    Procedure explained and questions answered to patient or proxy's satisfaction: yes     Patient identity confirmed:  Verbally with patient Location:    Type:  Abscess   Location:  Mouth   Mouth location:  Alveolar process Sedation:    Sedation type:  None Anesthesia:    Anesthesia method:  Nerve block   Block location:  Inferior alveolar block   Block needle gauge:  25 G   Block anesthetic:  Bupivacaine  0.25% WITH epi   Block injection procedure:  Anatomic landmarks identified, introduced needle, negative aspiration for blood, incremental injection and anatomic landmarks palpated   Block outcome:  Anesthesia achieved Procedure type:    Complexity:  Simple Procedure details:    Ultrasound guidance: no     Incision types:  Single straight   Incision depth:  Submucosal   Drainage:  Purulent   Drainage amount:  Moderate   Packing materials:  None Post-procedure details:    Procedure completion:  Tolerated well, no immediate complications    Medications Ordered in the ED  bupivacaine -epinephrine  (PF) (MARCAINE  W/ EPI) 0.5% -1:200000 injection 1.8 mL (has no administration in time range)  amoxicillin -clavulanate (AUGMENTIN ) 875-125 MG per tablet 1 tablet (1 tablet Oral Given 06/05/24 1743)                                    Medical Decision Making Ddx: dental abscess, dental caries, dental fracture, alveolar osteitis, ludwigs angina, other ED course: Patient present with pain in the right lower molars with a palpable. They speak in full and clear sentences,  handle their oral secretions  well. There is no sign of deep space abscess.  She is agreeable with incision and drainage and tolerated this well after inferior alveolar nerve block. Pain management: Pt given her alveolar nerve block with bupivacaine  for pain control Antibiotics provided for dental infection, patient was provided with outpatient dental resources and advised to come back to the ED for any new or worsening symptoms.   Amount and/or Complexity of Data Reviewed External Data Reviewed: notes.  Risk Prescription drug management.  Final diagnoses:  Dental abscess    ED Discharge Orders          Ordered    amoxicillin -clavulanate (AUGMENTIN ) 875-125 MG tablet  Every 12 hours,   Status:  Discontinued        06/05/24 1826    amoxicillin -clavulanate (AUGMENTIN ) 875-125 MG tablet  Every 12 hours        06/05/24 1827               Suellen Sherran LABOR, PA-C 06/05/24 1834    Elnor Jayson LABOR, DO 06/11/24 [phone]

## 2024-09-28 ENCOUNTER — Other Ambulatory Visit: Payer: Self-pay

## 2024-09-28 ENCOUNTER — Emergency Department (HOSPITAL_COMMUNITY)
Admission: EM | Admit: 2024-09-28 | Discharge: 2024-09-28 | Disposition: A | Discharge status: Home / Self Care | Attending: Emergency Medicine | Admitting: Emergency Medicine

## 2024-09-28 DIAGNOSIS — K047 Periapical abscess without sinus: Secondary | ICD-10-CM | POA: Insufficient documentation

## 2024-09-28 DIAGNOSIS — R6884 Jaw pain: Secondary | ICD-10-CM | POA: Diagnosis present

## 2024-09-28 DIAGNOSIS — Z79899 Other long term (current) drug therapy: Secondary | ICD-10-CM | POA: Diagnosis not present

## 2024-09-28 MED ORDER — AMOXICILLIN 500 MG PO CAPS: 500.0000 mg | ORAL_CAPSULE | Freq: Three times a day (TID) | ORAL | 0 refills | Status: AC

## 2024-09-28 MED ORDER — NAPROXEN 250 MG PO TABS
500.0000 mg | ORAL_TABLET | Freq: Once | ORAL | Status: AC
  Administered 2024-09-28: 500 mg via ORAL
  Filled 2024-09-28: qty 2

## 2024-09-28 MED ORDER — AMOXICILLIN 250 MG PO CAPS
500.0000 mg | ORAL_CAPSULE | Freq: Once | ORAL | Status: AC
  Administered 2024-09-28: 500 mg via ORAL
  Filled 2024-09-28: qty 2

## 2024-09-28 MED ORDER — NAPROXEN 500 MG PO TABS: 500.0000 mg | ORAL_TABLET | Freq: Two times a day (BID) | ORAL | 0 refills | Status: AC

## 2024-09-28 NOTE — ED Triage Notes (Signed)
 Pt c/o right lower jaw pain and swelling from broken tooth since last night.

## 2024-09-28 NOTE — Discharge Instructions (Signed)
 You may follow up with Dr. Cesar Baston: [phone]  [address] [place], [place] [address]   [facility] dental clinic -  [address] [place] - [place] [address] [phone]  [facility]) treats patients who are in the following groups:  Pediatrics with medicaid up to age 44 Patients over 57 with an orange card and a referral from a medical provider.  - If they are referred - there is a 40$ copay for any service - extration etc.  To get an orange card the following process is necessary.  Call the [facility] to apply for card - [phone] Once approved, you the patient will be set up with a medical provider Medical provider will refer patient to the [facility] clinic .  Offices accepting Medicaid patients:  Urgent tooth - [address] [place], [place] [address] [phone]  [facility] - [address] [place] [address] [phone]  [facility] (will see emergency dental patients from ED) - [address] [place], [place] [address] [phone]

## 2024-09-28 NOTE — ED Provider Notes (Signed)
 " [place] EMERGENCY DEPARTMENT AT [facility] Provider Note   CSN: [id number] Arrival date & time: 09/28/24  1652     Patient presents with: Abscess   Amber King is a 44 y.o. female.    Abscess This patient is a 44 year old female presenting with a complaint of right lower jaw swelling that started since last night after she has a bad tooth that is starting to get infected.  No fevers or chills, no difficulty opening or closing the mouth, she has started to notice some swelling on the gums and mild swelling of the lower jaw on the right side.  There is no pain under the neck or the tongue and she has been able to swallow breathe and talk without difficulty.  She has not seen a dentist in a long time and moved here 2 months ago.  She states she does have insurance and would be happy to be referred to local dentistry.     Prior to Admission medications  Medication Sig Start Date End Date Taking? Authorizing Provider  amoxicillin  (AMOXIL ) 500 MG capsule Take 1 capsule (500 mg total) by mouth 3 (three) times daily for 10 days. 09/28/24 10/08/24 Yes Cleotilde Rogue, MD  naproxen  (NAPROSYN ) 500 MG tablet Take 1 tablet (500 mg total) by mouth 2 (two) times daily with a meal. 09/28/24  Yes Cleotilde Rogue, MD  amoxicillin -clavulanate (AUGMENTIN ) 875-125 MG tablet Take 1 tablet by mouth every 12 (twelve) hours. 06/05/24   Suellen Cantor A, PA-C  chlorhexidine  (PERIDEX ) 0.12 % solution Use as directed 15 mLs in the mouth or throat 2 (two) times daily. 07/26/21   Kehrli, Kelsey F, PA-C  chlorhexidine  gluconate, MEDLINE KIT, (PERIDEX ) 0.12 % solution Use as directed 15 mLs in the mouth or throat 2 (two) times daily. 10/30/21   Couture, Cortni S, PA-C  meloxicam  (MOBIC ) 7.5 MG tablet Take 1 tablet (7.5 mg total) by mouth daily. 12/27/20   Graham, Laura E, PA-C  predniSONE  (DELTASONE ) 20 MG tablet Take 2 tablets (40 mg total) by mouth daily with breakfast. 07/20/23   Stuart Vernell Norris, PA-C   promethazine -dextromethorphan (PROMETHAZINE -DM) 6.25-15 MG/5ML syrup Take 5 mLs by mouth 4 (four) times daily as needed. 07/20/23   Stuart Vernell Norris, PA-C  clonazePAM (KLONOPIN) 0.5 MG tablet Take 0.5 mg by mouth at bedtime as needed.    04/23/20  [provider]  FLUoxetine (PROZAC) 40 MG capsule Take 40 mg by mouth daily.    04/23/20  [provider]  gabapentin (NEURONTIN) 300 MG capsule Take 300 mg by mouth 2 (two) times daily.    04/23/20  [provider]  ranitidine (ZANTAC) 150 MG capsule Take 150 mg by mouth 2 (two) times daily.    04/23/20  [provider]    Allergies: Patient has no known allergies.    Review of Systems  All other systems reviewed and are negative.   Updated Vital Signs BP 119/62 (BP Location: Right Arm)   Pulse 97   Temp 99.2 F (37.3 C) (Oral)   Resp 20   Ht 1.626 m (5' 4)   Wt 57.6 kg   LMP 09/09/2024 (Approximate)   SpO2 96%   BMI 21.80 kg/m   Physical Exam Vitals and nursing note reviewed.  Constitutional:      General: She is not in acute distress.    Appearance: She is well-developed. She is not diaphoretic.  HENT:     Head: Normocephalic and atraumatic.  Comments: Oropharynx is totally clear, tongue is able to move around without tenderness, no tenderness in the sublingual area, no trismus or torticollis, there is mild tenderness on the gingival surface abutting the lower premolar which has deep cavities and erosion, multiple other teeth with similar appearance but that is the only place there is tenderness on the gingiva, there is no area of fluctuance.    Mouth/Throat:     Pharynx: No oropharyngeal exudate.  Eyes:     General: No scleral icterus.    Conjunctiva/sclera: Conjunctivae normal.  Neck:     Thyroid: No thyromegaly.  Cardiovascular:     Rate and Rhythm: Normal rate and regular rhythm.  Pulmonary:     Effort: Pulmonary effort is normal.     Breath sounds: Normal breath sounds.   Musculoskeletal:     Cervical back: Normal range of motion and neck supple.  Lymphadenopathy:     Cervical: No cervical adenopathy.  Skin:    General: Skin is warm and dry.     Findings: No rash.  Neurological:     Mental Status: She is alert.     (all labs ordered are listed, but only abnormal results are displayed) Labs Reviewed - No data to display  EKG: None  Radiology: No results found.   Procedures   Medications Ordered in the ED  amoxicillin  (AMOXIL ) capsule 500 mg (has no administration in time range)  naproxen  (NAPROSYN ) tablet 500 mg (has no administration in time range)                                    Medical Decision Making Risk Prescription drug management.   Well-appearing, no fevers, early dental infection with mild swelling but appears to be phlegmon, amoxicillin  Naprosyn  and follow-up with dentist, patient given follow-up information.  Stable for discharge, she is agreeable to the plan.     Final diagnoses:  Dental abscess    ED Discharge Orders          Ordered    amoxicillin  (AMOXIL ) 500 MG capsule  3 times daily        09/28/24 1713    naproxen  (NAPROSYN ) 500 MG tablet  2 times daily with meals        09/28/24 1713               Cleotilde Rogue, MD 09/28/24 1715  "
